# Patient Record
Sex: Female | Born: 1940 | Race: White | Hispanic: No | Marital: Married | Smoking: Former smoker
Health system: Southern US, Community
[De-identification: ages and names within clinical notes are randomized; demographics above are authoritative.]

## PROBLEM LIST (undated history)

## (undated) DIAGNOSIS — R51 Headache: Secondary | ICD-10-CM

## (undated) DIAGNOSIS — E039 Hypothyroidism, unspecified: Secondary | ICD-10-CM

## (undated) DIAGNOSIS — R519 Headache, unspecified: Secondary | ICD-10-CM

## (undated) DIAGNOSIS — F32A Depression, unspecified: Secondary | ICD-10-CM

## (undated) DIAGNOSIS — E785 Hyperlipidemia, unspecified: Secondary | ICD-10-CM

## (undated) DIAGNOSIS — F329 Major depressive disorder, single episode, unspecified: Secondary | ICD-10-CM

## (undated) DIAGNOSIS — N6459 Other signs and symptoms in breast: Secondary | ICD-10-CM

## (undated) DIAGNOSIS — I10 Essential (primary) hypertension: Secondary | ICD-10-CM

## (undated) DIAGNOSIS — H18519 Endothelial corneal dystrophy, unspecified eye: Secondary | ICD-10-CM

## (undated) DIAGNOSIS — T4145XA Adverse effect of unspecified anesthetic, initial encounter: Secondary | ICD-10-CM

## (undated) DIAGNOSIS — M1990 Unspecified osteoarthritis, unspecified site: Secondary | ICD-10-CM

## (undated) DIAGNOSIS — H04129 Dry eye syndrome of unspecified lacrimal gland: Secondary | ICD-10-CM

## (undated) DIAGNOSIS — T8859XA Other complications of anesthesia, initial encounter: Secondary | ICD-10-CM

## (undated) DIAGNOSIS — D649 Anemia, unspecified: Secondary | ICD-10-CM

## (undated) DIAGNOSIS — F419 Anxiety disorder, unspecified: Secondary | ICD-10-CM

## (undated) DIAGNOSIS — Z8739 Personal history of other diseases of the musculoskeletal system and connective tissue: Secondary | ICD-10-CM

## (undated) DIAGNOSIS — B070 Plantar wart: Secondary | ICD-10-CM

## (undated) DIAGNOSIS — E7800 Pure hypercholesterolemia, unspecified: Secondary | ICD-10-CM

## (undated) DIAGNOSIS — Z8669 Personal history of other diseases of the nervous system and sense organs: Secondary | ICD-10-CM

## (undated) DIAGNOSIS — K259 Gastric ulcer, unspecified as acute or chronic, without hemorrhage or perforation: Secondary | ICD-10-CM

## (undated) DIAGNOSIS — T7840XA Allergy, unspecified, initial encounter: Secondary | ICD-10-CM

## (undated) DIAGNOSIS — K219 Gastro-esophageal reflux disease without esophagitis: Secondary | ICD-10-CM

## (undated) HISTORY — PX: DILATION AND CURETTAGE OF UTERUS: SHX78

## (undated) HISTORY — DX: Plantar wart: B07.0

## (undated) HISTORY — PX: UMBILICAL HERNIA REPAIR: SHX196

## (undated) HISTORY — DX: Unspecified osteoarthritis, unspecified site: M19.90

## (undated) HISTORY — PX: CORNEAL TRANSPLANT: SHX108

## (undated) HISTORY — DX: Hypothyroidism, unspecified: E03.9

## (undated) HISTORY — DX: Essential (primary) hypertension: I10

## (undated) HISTORY — DX: Major depressive disorder, single episode, unspecified: F32.9

## (undated) HISTORY — DX: Gastro-esophageal reflux disease without esophagitis: K21.9

## (undated) HISTORY — DX: Personal history of other diseases of the musculoskeletal system and connective tissue: Z87.39

## (undated) HISTORY — PX: OTHER SURGICAL HISTORY: SHX169

## (undated) HISTORY — DX: Personal history of other diseases of the nervous system and sense organs: Z86.69

## (undated) HISTORY — DX: Anxiety disorder, unspecified: F41.9

## (undated) HISTORY — PX: THYROIDECTOMY, PARTIAL: SHX18

## (undated) HISTORY — DX: Allergy, unspecified, initial encounter: T78.40XA

## (undated) HISTORY — PX: EYE SURGERY: SHX253

## (undated) HISTORY — DX: Hyperlipidemia, unspecified: E78.5

## (undated) HISTORY — DX: Pure hypercholesterolemia, unspecified: E78.00

## (undated) HISTORY — DX: Depression, unspecified: F32.A

---

## 1998-01-03 ENCOUNTER — Ambulatory Visit (HOSPITAL_COMMUNITY): Admission: RE | Admit: 1998-01-03 | Discharge: 1998-01-03 | Payer: Self-pay | Admitting: *Deleted

## 1998-01-25 ENCOUNTER — Other Ambulatory Visit: Admission: RE | Admit: 1998-01-25 | Discharge: 1998-01-25 | Payer: Self-pay | Admitting: Obstetrics and Gynecology

## 1998-05-03 ENCOUNTER — Other Ambulatory Visit: Admission: RE | Admit: 1998-05-03 | Discharge: 1998-05-03 | Payer: Self-pay | Admitting: Obstetrics and Gynecology

## 1998-07-28 ENCOUNTER — Other Ambulatory Visit: Admission: RE | Admit: 1998-07-28 | Discharge: 1998-07-28 | Payer: Self-pay | Admitting: Obstetrics and Gynecology

## 1999-02-01 ENCOUNTER — Other Ambulatory Visit: Admission: RE | Admit: 1999-02-01 | Discharge: 1999-02-01 | Payer: Self-pay | Admitting: Obstetrics and Gynecology

## 1999-04-21 ENCOUNTER — Encounter: Payer: Self-pay | Admitting: Obstetrics and Gynecology

## 1999-04-21 ENCOUNTER — Encounter: Admission: RE | Admit: 1999-04-21 | Discharge: 1999-04-21 | Payer: Self-pay | Admitting: Obstetrics and Gynecology

## 1999-10-02 ENCOUNTER — Other Ambulatory Visit: Admission: RE | Admit: 1999-10-02 | Discharge: 1999-10-02 | Payer: Self-pay | Admitting: Obstetrics and Gynecology

## 2000-02-01 ENCOUNTER — Other Ambulatory Visit: Admission: RE | Admit: 2000-02-01 | Discharge: 2000-02-01 | Payer: Self-pay | Admitting: Obstetrics and Gynecology

## 2000-03-05 ENCOUNTER — Ambulatory Visit (HOSPITAL_COMMUNITY): Admission: RE | Admit: 2000-03-05 | Discharge: 2000-03-05 | Payer: Self-pay | Admitting: *Deleted

## 2000-03-05 ENCOUNTER — Encounter: Payer: Self-pay | Admitting: *Deleted

## 2000-03-12 ENCOUNTER — Ambulatory Visit (HOSPITAL_COMMUNITY): Admission: RE | Admit: 2000-03-12 | Discharge: 2000-03-14 | Payer: Self-pay | Admitting: Specialist

## 2000-04-10 ENCOUNTER — Ambulatory Visit (HOSPITAL_COMMUNITY): Admission: RE | Admit: 2000-04-10 | Discharge: 2000-04-10 | Payer: Self-pay | Admitting: *Deleted

## 2000-04-10 ENCOUNTER — Encounter (INDEPENDENT_AMBULATORY_CARE_PROVIDER_SITE_OTHER): Payer: Self-pay | Admitting: Specialist

## 2000-04-23 ENCOUNTER — Encounter: Admission: RE | Admit: 2000-04-23 | Discharge: 2000-04-23 | Payer: Self-pay | Admitting: Obstetrics and Gynecology

## 2000-04-23 ENCOUNTER — Encounter: Payer: Self-pay | Admitting: Obstetrics and Gynecology

## 2000-06-26 ENCOUNTER — Encounter (INDEPENDENT_AMBULATORY_CARE_PROVIDER_SITE_OTHER): Payer: Self-pay

## 2000-06-26 ENCOUNTER — Ambulatory Visit (HOSPITAL_COMMUNITY): Admission: RE | Admit: 2000-06-26 | Discharge: 2000-06-26 | Payer: Self-pay | Admitting: Obstetrics and Gynecology

## 2001-05-20 ENCOUNTER — Other Ambulatory Visit: Admission: RE | Admit: 2001-05-20 | Discharge: 2001-05-20 | Payer: Self-pay | Admitting: Obstetrics and Gynecology

## 2001-05-27 ENCOUNTER — Encounter: Payer: Self-pay | Admitting: Obstetrics and Gynecology

## 2001-05-27 ENCOUNTER — Encounter: Admission: RE | Admit: 2001-05-27 | Discharge: 2001-05-27 | Payer: Self-pay | Admitting: Obstetrics and Gynecology

## 2001-08-20 ENCOUNTER — Ambulatory Visit (HOSPITAL_COMMUNITY): Admission: RE | Admit: 2001-08-20 | Discharge: 2001-08-20 | Payer: Self-pay | Admitting: Specialist

## 2002-05-20 ENCOUNTER — Other Ambulatory Visit: Admission: RE | Admit: 2002-05-20 | Discharge: 2002-05-20 | Payer: Self-pay | Admitting: Obstetrics and Gynecology

## 2002-06-01 ENCOUNTER — Encounter: Payer: Self-pay | Admitting: Obstetrics and Gynecology

## 2002-06-01 ENCOUNTER — Encounter: Admission: RE | Admit: 2002-06-01 | Discharge: 2002-06-01 | Payer: Self-pay | Admitting: Obstetrics and Gynecology

## 2002-09-03 ENCOUNTER — Ambulatory Visit (HOSPITAL_COMMUNITY): Admission: RE | Admit: 2002-09-03 | Discharge: 2002-09-03 | Payer: Self-pay | Admitting: *Deleted

## 2003-06-07 ENCOUNTER — Encounter: Admission: RE | Admit: 2003-06-07 | Discharge: 2003-06-07 | Payer: Self-pay | Admitting: Obstetrics and Gynecology

## 2004-07-05 ENCOUNTER — Other Ambulatory Visit: Admission: RE | Admit: 2004-07-05 | Discharge: 2004-07-05 | Payer: Self-pay | Admitting: Obstetrics and Gynecology

## 2004-08-02 ENCOUNTER — Encounter: Admission: RE | Admit: 2004-08-02 | Discharge: 2004-08-02 | Payer: Self-pay | Admitting: Obstetrics and Gynecology

## 2005-07-02 ENCOUNTER — Other Ambulatory Visit: Admission: RE | Admit: 2005-07-02 | Discharge: 2005-07-02 | Payer: Self-pay | Admitting: Obstetrics and Gynecology

## 2005-08-22 ENCOUNTER — Encounter: Admission: RE | Admit: 2005-08-22 | Discharge: 2005-08-22 | Payer: Self-pay | Admitting: Obstetrics and Gynecology

## 2005-12-31 ENCOUNTER — Encounter: Admission: RE | Admit: 2005-12-31 | Discharge: 2005-12-31 | Payer: Self-pay | Admitting: Internal Medicine

## 2006-08-27 ENCOUNTER — Encounter: Admission: RE | Admit: 2006-08-27 | Discharge: 2006-08-27 | Payer: Self-pay | Admitting: Obstetrics and Gynecology

## 2007-09-09 ENCOUNTER — Encounter: Admission: RE | Admit: 2007-09-09 | Discharge: 2007-09-09 | Payer: Self-pay | Admitting: Obstetrics and Gynecology

## 2008-06-24 ENCOUNTER — Encounter: Admission: RE | Admit: 2008-06-24 | Discharge: 2008-06-24 | Payer: Self-pay | Admitting: Orthopedic Surgery

## 2008-09-28 ENCOUNTER — Encounter: Admission: RE | Admit: 2008-09-28 | Discharge: 2008-09-28 | Payer: Self-pay | Admitting: Obstetrics and Gynecology

## 2009-08-30 DIAGNOSIS — N63 Unspecified lump in unspecified breast: Secondary | ICD-10-CM | POA: Insufficient documentation

## 2009-09-02 ENCOUNTER — Encounter: Admission: RE | Admit: 2009-09-02 | Discharge: 2009-09-02 | Payer: Self-pay | Admitting: Obstetrics and Gynecology

## 2009-10-10 ENCOUNTER — Encounter
Admission: RE | Admit: 2009-10-10 | Discharge: 2009-10-10 | Payer: Self-pay | Source: Home / Self Care | Admitting: Obstetrics and Gynecology

## 2010-05-12 ENCOUNTER — Emergency Department (HOSPITAL_COMMUNITY)
Admission: EM | Admit: 2010-05-12 | Discharge: 2010-05-12 | Payer: Self-pay | Source: Home / Self Care | Admitting: Emergency Medicine

## 2010-06-11 ENCOUNTER — Encounter: Payer: Self-pay | Admitting: Obstetrics and Gynecology

## 2010-09-12 ENCOUNTER — Other Ambulatory Visit: Payer: Self-pay | Admitting: Internal Medicine

## 2010-09-12 ENCOUNTER — Ambulatory Visit (HOSPITAL_COMMUNITY)
Admission: RE | Admit: 2010-09-12 | Discharge: 2010-09-12 | Disposition: A | Discharge status: Home / Self Care | Payer: MEDICARE | Source: Ambulatory Visit | Attending: Internal Medicine | Admitting: Internal Medicine

## 2010-09-12 DIAGNOSIS — M7989 Other specified soft tissue disorders: Secondary | ICD-10-CM

## 2010-09-12 DIAGNOSIS — R52 Pain, unspecified: Secondary | ICD-10-CM

## 2010-09-12 DIAGNOSIS — M79609 Pain in unspecified limb: Secondary | ICD-10-CM | POA: Insufficient documentation

## 2010-10-06 NOTE — Procedures (Signed)
[facility]  Patient:    CARTIER, MAPEL                       MRN: [id number] Proc. Date: 04/10/00 Adm. Date:  [id number] Attending:  Sabino Gasser                           Procedure Report  PROCEDURE:  Upper endoscopy.  ENDOSCOPIST:  Sabino Gasser, M.D.  ANESTHESIA:  Demerol 60 mg, Versed 6 mg.  DESCRIPTION OF PROCEDURE:  With the patient mildly sedated in the left lateral decubitus position, the Olympus videoscopic endoscope was inserted in the mouth and passed under direct vision through the esophagus which appeared grossly normal.  No evidence of Barretts.  We entered into the stomach. Fundus, body and antrum were visualized.  The antrum showed some erythema which we biopsied.  We entered into the duodenal bulb and second portion of the duodenum, both of which appeared normal.  From this point, the endoscope was then slowly withdrawn taking circumferential views of the entire duodenal mucosa and the endoscope then pulled back into the stomach and placed in retroflexion to view the stomach from below.  The endoscope was then straightened, and a guidewire was passed.  Subsequently, Savary dilators 15 and 17 were passed with minimal resistance.  The guidewire was removed with the last one.  The endoscope was reinserted and no bleeding was noted.  The patients vital signs and pulse oximeter remained stable.  The patient tolerated the procedure well without apparent complications.  FINDINGS:  Mild erythema of the distal stomach; await biopsy report.  The patient will call for the results.  We will await clinical response and have patient follow up with me as an outpatient and will order an esophageal manometry study to see if this has any contribution to the patients dysphagia if she does not tolerate dilation alone. DD:  04/10/00 TD:  04/11/00 Job: [id number]

## 2010-10-06 NOTE — Op Note (Signed)
[facility]  Patient:    Sandra Harvey, Sandra Harvey                       MRN: [id number] Proc. Date: 06/26/00 Adm. Date:  [id number] Attending:  Dierdre Forth Pearline                           Operative Report  PREOPERATIVE DIAGNOSIS:       Postmenopausal bleeding.  POSTOPERATIVE DIAGNOSES:      1. Postmenopausal bleeding.                               2. Endometrial polyp.  OPERATIONS:                   1. Diagnostic hysteroscopy.                               2. Diagnostic dilation and curettage.                               3. Removal of endometrial polyp.  SURGEON:                      Vanessa P. Pennie Rushing, M.D.  ANESTHESIA:                   General LMA.  ESTIMATED BLOOD LOSS:         Less than 25 cc.  COMPLICATIONS:                None.  FLUID DEFICIT FROM HYSTEROSCOPY:            40 cc.  FINDINGS:                     The uterus sounded to 7 cm.  There was a moderate amount of endocervical curettings.  The endometrium showed atrophic endometrium except for one polypoid lesion measuring approximately 1 cm.  DESCRIPTION OF PROCEDURE:     The patient was taken to the operating room after appropriate identification and placed on the operating table.  After the attainment of adequate general anesthesia, she was placed in the modified lithotomy position.  The perineum and vagina were prepped with multiple layers of Betadine and draped as a sterile field.  A weighted speculum was placed in the posterior vagina and a single tooth tenaculum was placed on the anterior cervix.  The uterus was sounded to 7 cm and the endocervical canal curetted. The hysteroscope was then used to visualize the endocervix and endometrial cavity.  The above noted polyp was visualized and documented.  Randall stone forceps were then used to remove that polypoid lesion and curettage of the remainder of the uterine cavity undertaken.  Hysteroscopic examination subsequent to  that revealed that the polyp had, indeed, been removed.  All instruments were then removed from the uterus and vagina and the patient awakened from general anesthesia then taken to the recovery room in satisfactory condition, having tolerated the procedure well with sponge and instrument counts correct.  SPECIMENS TO PATHOLOGY:       1. Endocervical curettings.  2. Endometrial polyp.                               3. Endometrial curettings. DD:  06/26/00 TD:  06/27/00 Job: [id number]

## 2010-10-06 NOTE — Op Note (Signed)
   NAME:  Sandra Harvey, Sandra Harvey                          ACCOUNT NO.:  [id number]   MEDICAL RECORD NO.:  [id number]                   PATIENT TYPE:  AMB   LOCATION:  ENDO                                 FACILITY:  [facility]   PHYSICIAN:  Georgiana Spinner, M.D.                 DATE OF BIRTH:  1940-08-28   DATE OF PROCEDURE:  09/03/2002  DATE OF DISCHARGE:                                 OPERATIVE REPORT   PROCEDURE:  Upper endoscopy with dilation.   INDICATIONS:  Dysphagia.   ANESTHESIA:  Demerol 50 mg, Versed 5 mg.   DESCRIPTION OF PROCEDURE:  With the patient mildly sedated in the left  lateral decubitus position, the Olympus videoscopic endoscope was inserted  in the mouth, passed under direct vision through the esophagus into the  stomach, through the pylorus, into the duodenal bulb, second portion of the  duodenum, all of which appeared grossly normal.  From this point the  endoscope was slowly withdrawn, taking circumferential views of the duodenal  mucosa until the endoscope pulled back into the stomach, placed in  retroflexion to view the stomach from below.  The endoscope was then  straightened and withdrawn, taking circumferential views of the gastric and  esophageal mucosa until the endoscope was pulled proximally, and then we  advanced the endoscope once again distally into the stomach, where a  guidewire was passed, the endoscope withdrawn.  Subsequently the Savary 17  mm dilator was passed easily over the guidewire.  With the dilator, the  guidewire was removed.  The patient's vital signs and pulse oximetry  remained stable.  The patient tolerated the procedure well without apparent  complications.   FINDINGS:  Unremarkable examination with dilation of esophagus to 17 Savary.  There was no heme on the dilator.   PLAN:  Await clinical response.  Proceed to colonoscopy.                                               Georgiana Spinner, M.D.    GMO/MEDQ  D:  09/03/2002  T:   09/03/2002  Job:  [id number]

## 2010-10-06 NOTE — Op Note (Signed)
   NAME:  Sandra Harvey, Sandra Harvey                          ACCOUNT NO.:  [id number]   MEDICAL RECORD NO.:  [id number]                   PATIENT TYPE:  AMB   LOCATION:  ENDO                                 FACILITY:  [facility]   PHYSICIAN:  Georgiana Spinner, M.D.                 DATE OF BIRTH:  [birth year]   DATE OF PROCEDURE:  09/03/2002  DATE OF DISCHARGE:                                 OPERATIVE REPORT   PROCEDURE PERFORMED:  Colonoscopy.   INDICATIONS FOR PROCEDURE:  Cancer screening.   ANESTHESIA:  None further given.   DESCRIPTION OF PROCEDURE:  With the patient mildly sedated in the left  lateral decubitus position, the Olympus videoscopic colonoscope was inserted  in the rectum and passed under direct vision to the cecum, identified by the  ileocecal valve and appendiceal orifice, both of which were photographed.  From this point, the colonoscope was slowly withdrawn, taking  circumferential views of the entire colonic mucosa stopping only in the  rectum, which appeared normal on direct and showed hemorrhoids on  retroflexed view.  The endoscope was straightened and withdrawn.  The  patient's vital signs and pulse oximetry remained stable.  The patient  tolerated the procedure well without apparent complications.   FINDINGS:  Internal hemorrhoids, otherwise unremarkable examination.   PLAN:  Will have patient follow up with me to assess clinical response of  esophageal dilation.                                               Georgiana Spinner, M.D.    GMO/MEDQ  D:  09/03/2002  T:  09/03/2002  Job:  [id number]

## 2010-11-27 ENCOUNTER — Other Ambulatory Visit: Payer: Self-pay | Admitting: Obstetrics and Gynecology

## 2010-11-27 DIAGNOSIS — Z1231 Encounter for screening mammogram for malignant neoplasm of breast: Secondary | ICD-10-CM

## 2010-12-04 ENCOUNTER — Ambulatory Visit
Admission: RE | Admit: 2010-12-04 | Discharge: 2010-12-04 | Disposition: A | Discharge status: Home / Self Care | Payer: Medicare (Managed Care) | Source: Ambulatory Visit | Attending: Obstetrics and Gynecology | Admitting: Obstetrics and Gynecology

## 2010-12-04 DIAGNOSIS — Z1231 Encounter for screening mammogram for malignant neoplasm of breast: Secondary | ICD-10-CM

## 2011-02-19 HISTORY — PX: HAND SURGERY: SHX662

## 2011-07-11 DIAGNOSIS — H2640 Unspecified secondary cataract: Secondary | ICD-10-CM | POA: Insufficient documentation

## 2011-10-08 ENCOUNTER — Ambulatory Visit (INDEPENDENT_AMBULATORY_CARE_PROVIDER_SITE_OTHER): Payer: 59 | Admitting: Obstetrics and Gynecology

## 2011-10-08 ENCOUNTER — Other Ambulatory Visit: Payer: Self-pay | Admitting: Obstetrics and Gynecology

## 2011-10-08 ENCOUNTER — Encounter: Payer: Self-pay | Admitting: Obstetrics and Gynecology

## 2011-10-08 VITALS — BP 118/60 | Resp 16 | Ht 61.5 in | Wt 172.0 lb

## 2011-10-08 DIAGNOSIS — K219 Gastro-esophageal reflux disease without esophagitis: Secondary | ICD-10-CM | POA: Insufficient documentation

## 2011-10-08 DIAGNOSIS — E039 Hypothyroidism, unspecified: Secondary | ICD-10-CM

## 2011-10-08 DIAGNOSIS — Z124 Encounter for screening for malignant neoplasm of cervix: Secondary | ICD-10-CM

## 2011-10-08 DIAGNOSIS — I2510 Atherosclerotic heart disease of native coronary artery without angina pectoris: Secondary | ICD-10-CM
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
## 2011-10-10 LAB — PAP IG W/ RFLX HPV ASCU

## 2011-11-13 ENCOUNTER — Other Ambulatory Visit: Payer: Self-pay | Admitting: Obstetrics and Gynecology

## 2011-11-13 DIAGNOSIS — Z1231 Encounter for screening mammogram for malignant neoplasm of breast: Secondary | ICD-10-CM

## 2011-12-13 ENCOUNTER — Ambulatory Visit
Admission: RE | Admit: 2011-12-13 | Discharge: 2011-12-13 | Disposition: A | Discharge status: Home / Self Care | Payer: Medicare Other | Source: Ambulatory Visit | Attending: Obstetrics and Gynecology | Admitting: Obstetrics and Gynecology

## 2011-12-13 DIAGNOSIS — Z1231 Encounter for screening mammogram for malignant neoplasm of breast: Secondary | ICD-10-CM

## 2011-12-17 ENCOUNTER — Encounter: Payer: Self-pay | Admitting: Obstetrics and Gynecology

## 2011-12-21 ENCOUNTER — Telehealth: Payer: Self-pay | Admitting: Obstetrics and Gynecology

## 2011-12-21 NOTE — Telephone Encounter (Signed)
Tc to pt per telephone call. Dense breast letter explained to pt. Pt voices understanding.  

## 2011-12-21 NOTE — Telephone Encounter (Signed)
Chandra/[facility]. [facility].

## 2011-12-21 NOTE — Telephone Encounter (Signed)
Pc to pt per telephone call. Number busy

## 2012-01-23 DIAGNOSIS — E119 Type 2 diabetes mellitus without complications: Secondary | ICD-10-CM | POA: Insufficient documentation

## 2012-07-23 DIAGNOSIS — Z961 Presence of intraocular lens: Secondary | ICD-10-CM | POA: Insufficient documentation

## 2012-11-14 ENCOUNTER — Other Ambulatory Visit: Payer: Self-pay

## 2012-11-14 DIAGNOSIS — Z1231 Encounter for screening mammogram for malignant neoplasm of breast: Secondary | ICD-10-CM

## 2012-12-16 ENCOUNTER — Ambulatory Visit: Payer: Medicare Other

## 2012-12-17 ENCOUNTER — Ambulatory Visit
Admission: RE | Admit: 2012-12-17 | Discharge: 2012-12-17 | Disposition: A | Discharge status: Home / Self Care | Payer: Medicare Other | Source: Ambulatory Visit

## 2012-12-17 DIAGNOSIS — Z1231 Encounter for screening mammogram for malignant neoplasm of breast: Secondary | ICD-10-CM

## 2013-01-30 DIAGNOSIS — Z947 Corneal transplant status: Secondary | ICD-10-CM | POA: Insufficient documentation

## 2013-12-30 ENCOUNTER — Other Ambulatory Visit: Payer: Self-pay

## 2013-12-30 DIAGNOSIS — Z1231 Encounter for screening mammogram for malignant neoplasm of breast: Secondary | ICD-10-CM

## 2014-01-08 ENCOUNTER — Ambulatory Visit
Admission: RE | Admit: 2014-01-08 | Discharge: 2014-01-08 | Disposition: A | Discharge status: Home / Self Care | Payer: 59 | Source: Ambulatory Visit

## 2014-01-08 DIAGNOSIS — Z1231 Encounter for screening mammogram for malignant neoplasm of breast: Secondary | ICD-10-CM

## 2014-06-03 ENCOUNTER — Ambulatory Visit (INDEPENDENT_AMBULATORY_CARE_PROVIDER_SITE_OTHER): Payer: Medicare Other

## 2014-06-03 VITALS — BP 144/72 | HR 76 | Resp 12

## 2014-06-03 DIAGNOSIS — M79676 Pain in unspecified toe(s): Secondary | ICD-10-CM

## 2014-06-03 DIAGNOSIS — M2041 Other hammer toe(s) (acquired), right foot: Secondary | ICD-10-CM

## 2014-06-03 DIAGNOSIS — B351 Tinea unguium: Secondary | ICD-10-CM

## 2014-06-03 NOTE — Patient Instructions (Signed)

## 2014-06-03 NOTE — Progress Notes (Signed)
   Subjective:    Patient ID: Sandra Harvey, female    DOB: August 27, 1940, 74 y.o.   MRN: [id number]  HPI PT STATED RT 2ND TOENAIL AND LT FOOT 1ST TOENAIL ARE SORE AND THICK FOR LONG TERM. THE TOENAILS ARE GETTING WORSE AND GET AGGRAVATED BY PRESSURE. TRIED NO TREATMENT.   Review of Systems  Skin: Positive for color change.  Neurological: Positive for headaches.  All other systems reviewed and are negative.      Objective:   Physical Exam 74 year old white female well-developed well-nourished oriented 3 presents at this time with a long-standing history of painful toes on the left hallux nail bed and the second toe right and thickening discoloration proptosis incurvation yellowing consistent with onychomycosis have had previous partial nail excision of nail excisions both hallux however the left hallux has regrown right hallux shows a eschar nailbed. 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 Appears to be contusion of the distal tuft of the second toe as well as painful onychomycosis of nails first left second right. Remaining nails in the right side also shows some thickening dystrophy and discoloration although not painful or symptomatic. Under the exam unremarkable rectus foot type pedal pulses palpable DP and PT +2 over 4 Refill time 3 seconds epicritic and proprioceptive sensations intact and symmetric there is normal plantar response DTRs not listed.       Assessment & Plan:  Assessment onychomycosis painful mycotic nails first left second right with friability dystrophy and discoloration mycotic nails debrided at this time may be candidate for future care recommend topical antifungal up on an as-needed basis for treatment if continues be an  issue nail excision could be considered. At this time also recommend appropriate accommodative shoes some tube foam padding dispensed for the second toe right foot help keep pressure off the distal clavus and keratosis of the second digit due to mallet toe type contracture. Follow-up in the future as needed  Alvan Dameichard Amany Rando DPM

## 2015-01-12 ENCOUNTER — Other Ambulatory Visit: Payer: Self-pay

## 2015-01-12 DIAGNOSIS — Z1231 Encounter for screening mammogram for malignant neoplasm of breast: Secondary | ICD-10-CM

## 2015-01-31 ENCOUNTER — Ambulatory Visit
Admission: RE | Admit: 2015-01-31 | Discharge: 2015-01-31 | Disposition: A | Discharge status: Home / Self Care | Payer: Medicare Other | Source: Ambulatory Visit

## 2015-01-31 DIAGNOSIS — Z1231 Encounter for screening mammogram for malignant neoplasm of breast: Secondary | ICD-10-CM

## 2016-02-02 ENCOUNTER — Other Ambulatory Visit: Payer: Self-pay | Admitting: Obstetrics and Gynecology

## 2016-02-02 DIAGNOSIS — Z1231 Encounter for screening mammogram for malignant neoplasm of breast: Secondary | ICD-10-CM

## 2016-02-08 ENCOUNTER — Ambulatory Visit
Admission: RE | Admit: 2016-02-08 | Discharge: 2016-02-08 | Disposition: A | Discharge status: Home / Self Care | Payer: Medicare Other | Source: Ambulatory Visit | Attending: Obstetrics and Gynecology | Admitting: Obstetrics and Gynecology

## 2016-02-08 DIAGNOSIS — Z1231 Encounter for screening mammogram for malignant neoplasm of breast: Secondary | ICD-10-CM

## 2016-12-28 ENCOUNTER — Other Ambulatory Visit: Payer: Self-pay | Admitting: Obstetrics and Gynecology

## 2016-12-28 DIAGNOSIS — Z1231 Encounter for screening mammogram for malignant neoplasm of breast: Secondary | ICD-10-CM

## 2017-02-25 ENCOUNTER — Encounter: Payer: Self-pay | Admitting: Podiatry

## 2017-02-25 ENCOUNTER — Ambulatory Visit (INDEPENDENT_AMBULATORY_CARE_PROVIDER_SITE_OTHER): Payer: Medicare Other | Admitting: Podiatry

## 2017-02-25 DIAGNOSIS — B351 Tinea unguium: Secondary | ICD-10-CM

## 2017-02-25 DIAGNOSIS — E1151 Type 2 diabetes mellitus with diabetic peripheral angiopathy without gangrene: Secondary | ICD-10-CM

## 2017-02-25 NOTE — Progress Notes (Signed)
Subjective:  Patient ID: Sandra Harvey, female    DOB: [birth year],  MRN: [id number]  Chief Complaint  Patient presents with  . Nail Problem    left great toe nail is tender  nail care     76 y.o. female presents with the above complaint. Endorses DM with good control. Reports history of attempted left great toenail removal with recurrence. Reports painful left great toenail. Reports other nails are thickened. States that she is unable to care for them herself  Past Medical History:  Diagnosis Date  . Allergy   . Anxiety   . Arthritis   . Cataract   . Depression   . Diabetes mellitus   . GERD (gastroesophageal reflux disease)   . History of migraine headaches   . History of osteopenia   . Hypercholesterolemia   . Hyperlipidemia   . Hypertension   . Hypothyroidism   . Plantar warts    Past Surgical History:  Procedure Laterality Date  . CORNEAL TRANSPLANT  2007, 2010  . DILATION AND CURETTAGE OF UTERUS    . EYE SURGERY    . HAND SURGERY  02/2011    Current Outpatient Prescriptions:  .  acetaminophen (TYLENOL) 325 MG tablet, Take 650 mg by mouth every 6 (six) hours as needed., Disp: , Rfl:  .  aspirin EC 81 MG tablet, Take 81 mg by mouth daily., Disp: , Rfl:  .  benzonatate (TESSALON) 100 MG capsule, Take 100 mg by mouth 3 (three) times daily as needed., Disp: , Rfl:  .  Chlorphen-Phenyleph-ASA (ALKA-SELTZER PLUS COLD PO), Take by mouth., Disp: , Rfl:  .  cholecalciferol (VITAMIN D) 1000 UNITS tablet, Take 1,000 Units by mouth 2 (two) times daily., Disp: , Rfl:  .  diphenhydrAMINE (BENADRYL) 25 mg capsule, Take 25 mg by mouth every 6 (six) hours as needed., Disp: , Rfl:  .  furosemide (LASIX) 40 MG tablet, Take 40 mg by mouth 2 (two) times daily., Disp: , Rfl:  .  imipramine (TOFRANIL) 50 MG tablet, Take 50 mg by mouth at bedtime., Disp: , Rfl:  .  levothyroxine (SYNTHROID, LEVOTHROID) 100 MCG tablet, Take 100 mcg by mouth daily., Disp: , Rfl:  .  levothyroxine  (SYNTHROID, LEVOTHROID) 112 MCG tablet, Take 112 mcg by mouth daily., Disp: , Rfl:  .  pantoprazole (PROTONIX) 40 MG tablet, Take 40 mg by mouth 2 (two) times daily., Disp: , Rfl:  .  polyvinyl alcohol-povidone (HYPOTEARS) 1.4-0.6 % ophthalmic solution, 1-2 drops as needed., Disp: , Rfl:  .  rosuvastatin (CRESTOR) 10 MG tablet, Take 10 mg by mouth daily., Disp: , Rfl:  .  rosuvastatin (CRESTOR) 5 MG tablet, Take 5 mg by mouth daily., Disp: , Rfl:  .  sertraline (ZOLOFT) 50 MG tablet, Take 50 mg by mouth daily., Disp: , Rfl:  .  telmisartan (MICARDIS) 40 MG tablet, Take 40 mg by mouth daily., Disp: , Rfl:   Allergies  Allergen Reactions  . Ibuprofen Nausea Only  . Nsaids Nausea And Vomiting    With prolonged use   Review of Systems Objective:  There were no vitals filed for this visit. General AA&O x3. Normal mood and affect.  Vascular Dorsalis pedis and posterior tibial pulses  present 1+ and absent bilaterally  Capillary refill normal to all digits. Pedal hair growth absent.  Neurologic Epicritic sensation present bilaterally.  Dermatologic No open lesions. Interspaces clear of maceration.  Normal skin temperature and turgor. Hyperkeratotic lesions: None bilaterally  Nails: brittle, discoloration Yellow,  onychomycosis, thickening  Orthopedic: No history of amputation. MMT 5/5 in dorsiflexion, plantarflexion, inversion, and eversion. Normal lower extremity joint ROM without pain or crepitus.   Assessment & Plan:  Patient was evaluated and treated and all questions answered.  Diabetes with PAD, Onychomycosis -Educated on diabetic footcare. Diabetic risk level 1 -Nails x10 debrided sharply and manually with large nail nipper and rotary burr.  Procedure: Nail Debridement Rationale: Patient meets criteria for routine foot care due to class B findings Type of Debridement: manual, sharp debridement. Instrumentation: Nail nipper, rotary burr. Number of Nails: 9  Return in about 3  months (around 05/28/2017).

## 2017-03-06 ENCOUNTER — Ambulatory Visit
Admission: RE | Admit: 2017-03-06 | Discharge: 2017-03-06 | Disposition: A | Discharge status: Home / Self Care | Payer: Medicare Other | Source: Ambulatory Visit | Attending: Obstetrics and Gynecology | Admitting: Obstetrics and Gynecology

## 2017-03-06 DIAGNOSIS — Z1231 Encounter for screening mammogram for malignant neoplasm of breast: Secondary | ICD-10-CM

## 2017-05-21 HISTORY — PX: UMBILICAL HERNIA REPAIR: SHX196

## 2017-05-21 HISTORY — PX: OTHER SURGICAL HISTORY: SHX169

## 2017-05-27 ENCOUNTER — Ambulatory Visit (INDEPENDENT_AMBULATORY_CARE_PROVIDER_SITE_OTHER): Payer: Medicare Other | Admitting: Podiatry

## 2017-05-27 DIAGNOSIS — B351 Tinea unguium: Secondary | ICD-10-CM

## 2017-05-27 DIAGNOSIS — E1151 Type 2 diabetes mellitus with diabetic peripheral angiopathy without gangrene: Secondary | ICD-10-CM

## 2017-05-31 DIAGNOSIS — G5600 Carpal tunnel syndrome, unspecified upper limb: Secondary | ICD-10-CM | POA: Insufficient documentation

## 2017-06-12 DIAGNOSIS — M25532 Pain in left wrist: Secondary | ICD-10-CM | POA: Insufficient documentation

## 2017-06-20 NOTE — Progress Notes (Signed)
  Subjective:  Patient ID: Sandra Harvey, female    DOB: 12-04-1940,  MRN: [id number]  Chief Complaint  Patient presents with  . Diabetes  . Nail Problem   77 y.o. female returns for diabetic foot care.  Did not check sugars this a.m.  Last saw her PCP Dr. Yancey FlemingsMulder far in November.  Objective:   General AA&O x3. Normal mood and affect.  Vascular Dorsalis pedis pulses present 1+ bilaterally  Posterior tibial pulses absent bilaterally  Capillary refill normal to all digits. Pedal hair growth normal.  Neurologic Epicritic sensation present bilaterally. Protective sensation with 5.07 monofilament  present bilaterally. Vibratory sensation present bilaterally.  Dermatologic No open lesions. Interspaces clear of maceration.  Normal skin temperature and turgor. Hyperkeratotic lesions: None bilaterally. Nails: brittle, onychomycosis, thickening, elongation  Orthopedic: No history of amputation. MMT 5/5 in dorsiflexion, plantarflexion, inversion, and eversion. Normal lower extremity joint ROM without pain or crepitus.   Assessment & Plan:  Patient was evaluated and treated and all questions answered.  Diabetes with PAD, Onychomycosis -Educated on diabetic footcare. Diabetic risk level 1 -At risk foot care provided as below.  Procedure: Nail Debridement Rationale: Patient meets criteria for routine foot care due to class B findings Type of Debridement: manual, sharp debridement. Instrumentation: Nail nipper, rotary burr. Number of Nails: 10  Return in about 3 months (around 08/25/2017).

## 2017-08-23 DIAGNOSIS — M169 Osteoarthritis of hip, unspecified: Secondary | ICD-10-CM | POA: Insufficient documentation

## 2017-08-23 NOTE — H&P (Signed)
TOTAL HIP ADMISSION H&P  Patient is admitted for right total hip arthroplasty, anterior approach.  Subjective:  Chief Complaint:     Right hip primary OA / pain  HPI: Sandra Harvey, 77 y.o. female, has a history of pain and functional disability in the right hip(s) due to arthritis and patient has failed non-surgical conservative treatments for greater than 12 weeks to include NSAID's and/or analgesics, corticosteriod injections, use of assistive devices and activity modification.  Onset of symptoms was gradual starting 2+ years ago with gradually worsening course since that time.The patient noted no past surgery on the right hip(s).  Patient currently rates pain in the right hip at 10 out of 10 with activity. Patient has night pain, worsening of pain with activity and weight bearing, trendelenberg gait, pain that interfers with activities of daily living and pain with passive range of motion. Patient has evidence of periarticular osteophytes and joint space narrowing by imaging studies. This condition presents safety issues increasing the risk of falls.  There is no current active infection.  Risks, benefits and expectations were discussed with the patient.  Risks including but not limited to the risk of anesthesia, blood clots, nerve damage, blood vessel damage, failure of the prosthesis, infection and up to and including death.  Patient understand the risks, benefits and expectations and wishes to proceed with surgery.   PCP: Merri Brunette, MD  D/C Plans:       Home   Post-op Meds:       No Rx given   Tranexamic Acid:      To be given - IV   Decadron:      Is to be given  FYI:     ASA  Norco  DME:    Pt already has equipment   PT:    No PT    Patient Active Problem List   Diagnosis Date Noted  . Cardiovascular disease 10/08/2011  . GERD (gastroesophageal reflux disease) 10/08/2011  . Hypothyroidism 10/08/2011   Past Medical History:  Diagnosis Date  . Allergy   . Anxiety   .  Arthritis   . Cataract   . Depression   . Diabetes mellitus   . GERD (gastroesophageal reflux disease)   . History of migraine headaches   . History of osteopenia   . Hypercholesterolemia   . Hyperlipidemia   . Hypertension   . Hypothyroidism   . Plantar warts     Past Surgical History:  Procedure Laterality Date  . CORNEAL TRANSPLANT  2007, 2010  . DILATION AND CURETTAGE OF UTERUS    . EYE SURGERY    . HAND SURGERY  02/2011    No current facility-administered medications for this encounter.    Current Outpatient Medications  Medication Sig Dispense Refill Last Dose  . acetaminophen (TYLENOL) 325 MG tablet Take 650 mg by mouth every 6 (six) hours as needed.   Taking  . aspirin EC 81 MG tablet Take 81 mg by mouth daily.   Taking  . benzonatate (TESSALON) 100 MG capsule Take 100 mg by mouth 3 (three) times daily as needed.   Taking  . Chlorphen-Phenyleph-ASA (ALKA-SELTZER PLUS COLD PO) Take by mouth.   Taking  . cholecalciferol (VITAMIN D) 1000 UNITS tablet Take 1,000 Units by mouth 2 (two) times daily.   Taking  . diphenhydrAMINE (BENADRYL) 25 mg capsule Take 25 mg by mouth every 6 (six) hours as needed.   Taking  . furosemide (LASIX) 40 MG tablet Take 40 mg  by mouth 2 (two) times daily.   Taking  . imipramine (TOFRANIL) 50 MG tablet Take 50 mg by mouth at bedtime.   Taking  . levothyroxine (SYNTHROID, LEVOTHROID) 100 MCG tablet Take 100 mcg by mouth daily.   Taking  . levothyroxine (SYNTHROID, LEVOTHROID) 112 MCG tablet Take 112 mcg by mouth daily.   Taking  . pantoprazole (PROTONIX) 40 MG tablet Take 40 mg by mouth 2 (two) times daily.   Taking  . polyvinyl alcohol-povidone (HYPOTEARS) 1.4-0.6 % ophthalmic solution 1-2 drops as needed.   Taking  . rosuvastatin (CRESTOR) 10 MG tablet Take 10 mg by mouth daily.   Taking  . rosuvastatin (CRESTOR) 5 MG tablet Take 5 mg by mouth daily.   Taking  . sertraline (ZOLOFT) 50 MG tablet Take 50 mg by mouth daily.   Taking  . telmisartan  (MICARDIS) 40 MG tablet Take 40 mg by mouth daily.   Taking   Allergies  Allergen Reactions  . Ibuprofen Nausea Only  . Nsaids Nausea And Vomiting    With prolonged use    Social History   Tobacco Use  . Smoking status: Former Smoker    Packs/day: 1.00    Years: 30.00    Pack years: 30.00    Types: Cigarettes    Last attempt to quit: 11/07/1992    Years since quitting: 24.8  . Smokeless tobacco: Never Used  Substance Use Topics  . Alcohol use: Yes    Alcohol/week: 0.6 oz    Types: 1 Glasses of wine per week    Family History  Problem Relation Age of Onset  . Diabetes Sister   . Hypertension Sister   . Breast cancer Neg Hx      Review of Systems  Constitutional: Negative.   HENT: Negative.   Eyes: Negative.   Respiratory: Negative.   Cardiovascular: Negative.   Gastrointestinal: Positive for heartburn.  Genitourinary: Negative.   Musculoskeletal: Positive for back pain and joint pain.  Skin: Negative.   Neurological: Positive for headaches.  Endo/Heme/Allergies: Positive for environmental allergies.  Psychiatric/Behavioral: Positive for depression. The patient is nervous/anxious.     Objective:  Physical Exam  Constitutional: She is oriented to person, place, and time. She appears well-developed.  HENT:  Head: Normocephalic.  Eyes: Pupils are equal, round, and reactive to light.  Neck: Neck supple. No JVD present. No tracheal deviation present. No thyromegaly present.  Cardiovascular: Normal rate, regular rhythm and intact distal pulses.  Respiratory: Effort normal and breath sounds normal. No respiratory distress. She has no wheezes.  GI: Soft. There is no tenderness. There is no guarding.  Musculoskeletal:       Right hip: She exhibits decreased range of motion, decreased strength, tenderness and bony tenderness. She exhibits no swelling, no deformity and no laceration.  Lymphadenopathy:    She has no cervical adenopathy.  Neurological: She is alert and  oriented to person, place, and time. A sensory deficit (right foot numbness) is present.  Skin: Skin is warm and dry.  Psychiatric: She has a normal mood and affect.      Labs:  Estimated body mass index is 31.97 kg/m as calculated from the following:   Height as of 10/08/11: 5' 1.5" (1.562 m).   Weight as of 10/08/11: 78 kg (172 lb).   Imaging Review Plain radiographs demonstrate severe degenerative joint disease of the right hip. The bone quality appears to be good for age and reported activity level.    Preoperative templating of the  joint replacement has been completed, documented, and submitted to the Operating Room personnel in order to optimize intra-operative equipment management.    Patient's anticipated LOS is less than 2 midnights, meeting these requirements: - Lives within 1 hour of care - Has a competent adult at home to recover with post-op recover - NO history of  - Chronic pain requiring opiods  - Diabetes  - Coronary Artery Disease  - Heart failure  - Heart attack  - Stroke  - DVT/VTE  - Cardiac arrhythmia  - Respiratory Failure/COPD  - Renal failure  - Anemia  - Advanced Liver disease        Assessment/Plan:  End stage arthritis, right hip  The patient history, physical examination, clinical judgement of the provider and imaging studies are consistent with end stage degenerative joint disease of the right hip(s) and total hip arthroplasty is deemed medically necessary. The treatment options including medical management, injection therapy, arthroscopy and arthroplasty were discussed at length. The risks and benefits of total hip arthroplasty were presented and reviewed. The risks due to aseptic loosening, infection, stiffness, dislocation/subluxation,  thromboembolic complications and other imponderables were discussed.  The patient acknowledged the explanation, agreed to proceed with the plan and consent was signed. Patient is being admitted for  inpatient treatment for surgery, pain control, PT, OT, prophylactic antibiotics, VTE prophylaxis, progressive ambulation and ADL's and discharge planning.The patient is planning to be discharged home.     Anastasio AuerbachMatthew S. Shay Jhaveri   PA-C  08/23/2017, 12:35 PM

## 2017-08-26 ENCOUNTER — Ambulatory Visit (INDEPENDENT_AMBULATORY_CARE_PROVIDER_SITE_OTHER): Payer: Medicare Other | Admitting: Podiatry

## 2017-08-26 DIAGNOSIS — E1151 Type 2 diabetes mellitus with diabetic peripheral angiopathy without gangrene: Secondary | ICD-10-CM | POA: Diagnosis not present

## 2017-08-26 DIAGNOSIS — B351 Tinea unguium: Secondary | ICD-10-CM | POA: Diagnosis not present

## 2017-08-26 NOTE — Progress Notes (Signed)
  Subjective:  Patient ID: Sandra Harvey, female    DOB: 01-04-1941,  MRN: [id number]  Chief Complaint  Patient presents with  . debride    B/L debridement -sugar: " I don't have to take it anymore" A1C: 7 PCP: Pharr LOV: x 1 week   77 y.o. female returns for diabetic foot care.  States that she does not check her sugars anymore.  Less[place] her PCP Dr. Renne CriglerPharr 1 week ago.  Denies nausea vomiting fever chills.  Objective:   General AA&O x3. Normal mood and affect.  Vascular Dorsalis pedis pulses present 1+ bilaterally  Posterior tibial pulses absent bilaterally  Capillary refill normal to all digits. Pedal hair growth normal.  Neurologic Epicritic sensation present bilaterally. Protective sensation with 5.07 monofilament  present bilaterally. Vibratory sensation present bilaterally.  Dermatologic No open lesions. Interspaces clear of maceration.  Normal skin temperature and turgor. Hyperkeratotic lesions: None bilaterally. Nails: brittle, onychomycosis, thickening, elongation  Orthopedic: No history of amputation. MMT 5/5 in dorsiflexion, plantarflexion, inversion, and eversion. Normal lower extremity joint ROM without pain or crepitus.   Assessment & Plan:  Patient was evaluated and treated and all questions answered.  Diabetes with PAD, Onychomycosis -Educated on diabetic footcare. Diabetic risk level 1 -Nails x10 debrided sharply and manually with large nail nipper and rotary burr.   Procedure: Nail Debridement Rationale: Patient meets criteria for routine foot care due to Class B findings. Type of Debridement: manual, sharp debridement. Instrumentation: Nail nipper, rotary burr. Number of Nails: 10     Return in about 3 months (around 11/25/2017).

## 2017-09-09 NOTE — Patient Instructions (Signed)
Kristine RoyalBetty W Awan  09/09/2017   Your procedure is scheduled on: 09/17/2017   [facility]  Report to admitting at     0530 AM    Call this number if you have problems the morning of surgery [phone]   Remember: Do not eat food or drink liquids :After Midnight.     Take these medicines the morning of surgery with A SIP OF WATER: Synthroid, Protonix, Zoloft, Amlodipine ( Norvasc), Zyrtec if needed, Dexilant  DO NOT TAKE ANY DIABETIC MEDICATIONS DAY OF YOUR SURGERY                               You may not have any metal on your body including hair pins and              piercings  Do not wear jewelry, make-up, lotions, powders or perfumes, deodorant             Do not wear nail polish.  Do not shave  48 hours prior to surgery.     Do not bring valuables to the hospital. [facility] IS NOT             RESPONSIBLE   FOR VALUABLES.  Contacts, dentures or bridgework may not be worn into surgery.  Leave suitcase in the car. After surgery it may be brought to your room.                  Please read over the following fact sheets you were given: _____________________________________________________________________             [facility] - Preparing for Surgery Before surgery, you can play an important role.  Because skin is not sterile, your skin needs to be as free of germs as possible.  You can reduce the number of germs on your skin by washing with CHG (chlorahexidine gluconate) soap before surgery.  CHG is an antiseptic cleaner which kills germs and bonds with the skin to continue killing germs even after washing. Please DO NOT use if you have an allergy to CHG or antibacterial soaps.  If your skin becomes reddened/irritated stop using the CHG and inform your nurse when you arrive at Short Stay. Do not shave (including legs and underarms) for at least 48 hours prior to the first CHG shower.  You may shave your face/neck. Please follow  these instructions carefully:  1.  Shower with CHG Soap the night before surgery and the  morning of Surgery.  2.  If you choose to wash your hair, wash your hair first as usual with your  normal  shampoo.  3.  After you shampoo, rinse your hair and body thoroughly to remove the  shampoo.                           4.  Use CHG as you would any other liquid soap.  You can apply chg directly  to the skin and wash                       Gently with a scrungie or clean washcloth.  5.  Apply the CHG Soap to your body ONLY FROM THE NECK DOWN.   Do not use on face/ open  Wound or open sores. Avoid contact with eyes, ears mouth and genitals (private parts).                       Wash face,  Genitals (private parts) with your normal soap.             6.  Wash thoroughly, paying special attention to the area where your surgery  will be performed.  7.  Thoroughly rinse your body with warm water from the neck down.  8.  DO NOT shower/wash with your normal soap after using and rinsing off  the CHG Soap.                9.  Pat yourself dry with a clean towel.            10.  Wear clean pajamas.            11.  Place clean sheets on your bed the night of your first shower and do not  sleep with pets. Day of Surgery : Do not apply any lotions/deodorants the morning of surgery.  Please wear clean clothes to the hospital/surgery center.  FAILURE TO FOLLOW THESE INSTRUCTIONS MAY RESULT IN THE CANCELLATION OF YOUR SURGERY PATIENT SIGNATURE_________________________________  NURSE SIGNATURE__________________________________  ________________________________________________________________________  WHAT IS A BLOOD TRANSFUSION? Blood Transfusion Information  A transfusion is the replacement of blood or some of its parts. Blood is made up of multiple cells which provide different functions.  Red blood cells carry oxygen and are used for blood loss replacement.  White blood cells fight  against infection.  Platelets control bleeding.  Plasma helps clot blood.  Other blood products are available for specialized needs, such as hemophilia or other clotting disorders. BEFORE THE TRANSFUSION  Who gives blood for transfusions?   Healthy volunteers who are fully evaluated to make sure their blood is safe. This is blood bank blood. Transfusion therapy is the safest it has ever been in the practice of medicine. Before blood is taken from a donor, a complete history is taken to make sure that person has no history of diseases nor engages in risky social behavior (examples are intravenous drug use or sexual activity with multiple partners). The donor's travel history is screened to minimize risk of transmitting infections, such as malaria. The donated blood is tested for signs of infectious diseases, such as HIV and hepatitis. The blood is then tested to be sure it is compatible with you in order to minimize the chance of a transfusion reaction. If you or a relative donates blood, this is often done in anticipation of surgery and is not appropriate for emergency situations. It takes many days to process the donated blood. RISKS AND COMPLICATIONS Although transfusion therapy is very safe and saves many lives, the main dangers of transfusion include:   Getting an infectious disease.  Developing a transfusion reaction. This is an allergic reaction to something in the blood you were given. Every precaution is taken to prevent this. The decision to have a blood transfusion has been considered carefully by your caregiver before blood is given. Blood is not given unless the benefits outweigh the risks. AFTER THE TRANSFUSION  Right after receiving a blood transfusion, you will usually feel much better and more energetic. This is especially true if your red blood cells have gotten low (anemic). The transfusion raises the level of the red blood cells which carry oxygen, and this usually causes an  energy increase.  The nurse administering the transfusion will monitor you carefully for complications. HOME CARE INSTRUCTIONS  No special instructions are needed after a transfusion. You may find your energy is better. Speak with your caregiver about any limitations on activity for underlying diseases you may have. SEEK MEDICAL CARE IF:   Your condition is not improving after your transfusion.  You develop redness or irritation at the intravenous (IV) site. SEEK IMMEDIATE MEDICAL CARE IF:  Any of the following symptoms occur over the next 12 hours:  Shaking chills.  You have a temperature by mouth above 102 F (38.9 C), not controlled by medicine.  Chest, back, or muscle pain.  People around you feel you are not acting correctly or are confused.  Shortness of breath or difficulty breathing.  Dizziness and fainting.  You get a rash or develop hives.  You have a decrease in urine output.  Your urine turns a dark color or changes to pink, red, or brown. Any of the following symptoms occur over the next 10 days:  You have a temperature by mouth above 102 F (38.9 C), not controlled by medicine.  Shortness of breath.  Weakness after normal activity.  The white part of the eye turns yellow (jaundice).  You have a decrease in the amount of urine or are urinating less often.  Your urine turns a dark color or changes to pink, red, or brown. Document Released: 05/04/2000 Document Revised: 07/30/2011 Document Reviewed: 12/22/2007 ExitCare Patient Information 2014 [place].  _______________________________________________________________________  Incentive Spirometer  An incentive spirometer is a tool that can help keep your lungs clear and active. This tool measures how well you are filling your lungs with each breath. Taking long deep breaths may help reverse or decrease the chance of developing breathing (pulmonary) problems (especially infection) following:  A  long period of time when you are unable to move or be active. BEFORE THE PROCEDURE   If the spirometer includes an indicator to show your best effort, your nurse or respiratory therapist will set it to a desired goal.  If possible, sit up straight or lean slightly forward. Try not to slouch.  Hold the incentive spirometer in an upright position. INSTRUCTIONS FOR USE  1. Sit on the edge of your bed if possible, or sit up as far as you can in bed or on a chair. 2. Hold the incentive spirometer in an upright position. 3. Breathe out normally. 4. Place the mouthpiece in your mouth and seal your lips tightly around it. 5. Breathe in slowly and as deeply as possible, raising the piston or the ball toward the top of the column. 6. Hold your breath for 3-5 seconds or for as long as possible. Allow the piston or ball to fall to the bottom of the column. 7. Remove the mouthpiece from your mouth and breathe out normally. 8. Rest for a few seconds and repeat Steps 1 through 7 at least 10 times every 1-2 hours when you are awake. Take your time and take a few normal breaths between deep breaths. 9. The spirometer may include an indicator to show your best effort. Use the indicator as a goal to work toward during each repetition. 10. After each set of 10 deep breaths, practice coughing to be sure your lungs are clear. If you have an incision (the cut made at the time of surgery), support your incision when coughing by placing a pillow or rolled up towels firmly against it. Once you are able to get  out of bed, walk around indoors and cough well. You may stop using the incentive spirometer when instructed by your caregiver.  RISKS AND COMPLICATIONS  Take your time so you do not get dizzy or light-headed.  If you are in pain, you may need to take or ask for pain medication before doing incentive spirometry. It is harder to take a deep breath if you are having pain. AFTER USE  Rest and breathe slowly and  easily.  It can be helpful to keep track of a log of your progress. Your caregiver can provide you with a simple table to help with this. If you are using the spirometer at home, follow these instructions: [place] IF:   You are having difficultly using the spirometer.  You have trouble using the spirometer as often as instructed.  Your pain medication is not giving enough relief while using the spirometer.  You develop fever of 100.5 F (38.1 C) or higher. SEEK IMMEDIATE MEDICAL CARE IF:   You cough up bloody sputum that had not been present before.  You develop fever of 102 F (38.9 C) or greater.  You develop worsening pain at or near the incision site. MAKE SURE YOU:   Understand these instructions.  Will watch your condition.  Will get help right away if you are not doing well or get worse. Document Released: 09/17/2006 Document Revised: 07/30/2011 Document Reviewed: 11/18/2006 [facility] Patient Information 2014 [place], [place].   ________________________________________________________________________

## 2017-09-11 ENCOUNTER — Encounter (HOSPITAL_COMMUNITY): Payer: Self-pay | Admitting: *Deleted

## 2017-09-11 ENCOUNTER — Other Ambulatory Visit: Payer: Self-pay

## 2017-09-11 ENCOUNTER — Encounter (HOSPITAL_COMMUNITY)
Admission: RE | Admit: 2017-09-11 | Discharge: 2017-09-11 | Disposition: A | Discharge status: Home / Self Care | Payer: Medicare Other | Source: Ambulatory Visit | Attending: Orthopedic Surgery | Admitting: Orthopedic Surgery

## 2017-09-11 DIAGNOSIS — M1611 Unilateral primary osteoarthritis, right hip: Secondary | ICD-10-CM | POA: Insufficient documentation

## 2017-09-11 DIAGNOSIS — E7800 Pure hypercholesterolemia, unspecified: Secondary | ICD-10-CM | POA: Insufficient documentation

## 2017-09-11 DIAGNOSIS — M25551 Pain in right hip: Secondary | ICD-10-CM | POA: Insufficient documentation

## 2017-09-11 DIAGNOSIS — E039 Hypothyroidism, unspecified: Secondary | ICD-10-CM | POA: Diagnosis not present

## 2017-09-11 DIAGNOSIS — D649 Anemia, unspecified: Secondary | ICD-10-CM | POA: Diagnosis not present

## 2017-09-11 DIAGNOSIS — F329 Major depressive disorder, single episode, unspecified: Secondary | ICD-10-CM | POA: Insufficient documentation

## 2017-09-11 DIAGNOSIS — E785 Hyperlipidemia, unspecified: Secondary | ICD-10-CM | POA: Diagnosis not present

## 2017-09-11 DIAGNOSIS — E119 Type 2 diabetes mellitus without complications: Secondary | ICD-10-CM | POA: Diagnosis not present

## 2017-09-11 DIAGNOSIS — I10 Essential (primary) hypertension: Secondary | ICD-10-CM | POA: Insufficient documentation

## 2017-09-11 DIAGNOSIS — Z01812 Encounter for preprocedural laboratory examination: Secondary | ICD-10-CM | POA: Insufficient documentation

## 2017-09-11 HISTORY — DX: Adverse effect of unspecified anesthetic, initial encounter: T41.45XA

## 2017-09-11 HISTORY — DX: Headache: R51

## 2017-09-11 HISTORY — DX: Headache, unspecified: R51.9

## 2017-09-11 HISTORY — DX: Gastric ulcer, unspecified as acute or chronic, without hemorrhage or perforation: K25.9

## 2017-09-11 HISTORY — DX: Other complications of anesthesia, initial encounter: T88.59XA

## 2017-09-11 HISTORY — DX: Anemia, unspecified: D64.9

## 2017-09-11 LAB — CBC
HCT: 37.1 % (ref 36.0–46.0)
Hemoglobin: 11.5 g/dL — ABNORMAL LOW (ref 12.0–15.0)
MCH: 28.6 pg (ref 26.0–34.0)
MCHC: 31 g/dL (ref 30.0–36.0)
MCV: 92.3 fL (ref 78.0–100.0)
Platelets: 364 10*3/uL (ref 150–400)
RBC: 4.02 MIL/uL (ref 3.87–5.11)
RDW: 13.9 % (ref 11.5–15.5)
WBC: 7.6 10*3/uL (ref 4.0–10.5)

## 2017-09-11 LAB — SURGICAL PCR SCREEN
MRSA, PCR: NEGATIVE
Staphylococcus aureus: NEGATIVE

## 2017-09-11 LAB — GLUCOSE, CAPILLARY: Glucose-Capillary: 120 mg/dL — ABNORMAL HIGH (ref 65–99)

## 2017-09-11 LAB — HEMOGLOBIN A1C
Hgb A1c MFr Bld: 5.9 % — ABNORMAL HIGH (ref 4.8–5.6)
Mean Plasma Glucose: 122.63 mg/dL

## 2017-09-11 LAB — BASIC METABOLIC PANEL
Anion gap: 9 (ref 5–15)
BUN: 22 mg/dL — ABNORMAL HIGH (ref 6–20)
CHLORIDE: 103 mmol/L (ref 101–111)
CO2: 27 mmol/L (ref 22–32)
Calcium: 9.1 mg/dL (ref 8.9–10.3)
Creatinine, Ser: 1.06 mg/dL — ABNORMAL HIGH (ref 0.44–1.00)
GFR calc non Af Amer: 49 mL/min — ABNORMAL LOW (ref >60)
GFR, EST AFRICAN AMERICAN: 57 mL/min — AB (ref >60)
Glucose, Bld: 72 mg/dL (ref 65–99)
POTASSIUM: 3.9 mmol/L (ref 3.5–5.1)
Sodium: 139 mmol/L (ref 135–145)

## 2017-09-11 NOTE — Progress Notes (Addendum)
LOV- 08/20/17- Dr Renne CriglerPharr on chart EKG-09/06/14 on chart EKG-08/28/2017 on chart  08/20/17-CBC,Diff, on chart. CMP-08/20/17 on chart  Endoscopy note- 08/29/2017 on chart  Requestesd most recent EKG from office of Dr Renne CriglerPharr and clearance from Dr Edrick OhPhar.  LVMM for Medical Records at office of Dr Renne CriglerPharr.

## 2017-09-11 NOTE — Progress Notes (Signed)
LVMM for Sandra Harvey at office of DR Charlann Boxerlin regarding endo report from Dr Elnoria HowardHung on chart noting recent ulcer diagnosis .  Patient states Dr Elnoria HowardHung was to have notified Dr Charlann Boxerlin.  Patient stated Dr Elnoria HowardHung advised her not to have surgery at this time.

## 2017-09-12 LAB — ABO/RH: ABO/RH(D): O POS

## 2017-09-12 NOTE — Progress Notes (Signed)
Sherry from office fo Dr Charlann Boxerlin called back and stated that Dr Charlann Boxerlin aware of recent endoscopy procedure with ulcer diagnosis.  Sherrie faxed to [facility] the clearance from office of PCP Dr Renne CriglerPharr.  Nurse informed Sherrie that at preop appointment that patient stated " Dr hung told me I should not have this surgery right now, but I am going to have it anyway. ".

## 2017-09-16 MED ORDER — TRANEXAMIC ACID 1000 MG/10ML IV SOLN
1000.0000 mg | INTRAVENOUS | Status: AC
  Administered 2017-09-17: 1000 mg via INTRAVENOUS
  Filled 2017-09-16: qty 1100

## 2017-09-16 NOTE — Progress Notes (Signed)
Patient called and said sister cannot stay overnight with her after surgery tomorrow due to pinched nerve., but she has driver home, patient to notify dr Charlann Boxer office. Patient husband will be in home with patient he has mild dementia.

## 2017-09-17 ENCOUNTER — Inpatient Hospital Stay (HOSPITAL_COMMUNITY): Payer: Medicare Other

## 2017-09-17 ENCOUNTER — Inpatient Hospital Stay (HOSPITAL_COMMUNITY)
Admission: RE | Admit: 2017-09-17 | Discharge: 2017-09-19 | DRG: 470 | Disposition: A | Discharge status: Home Health | Payer: Medicare Other | Source: Ambulatory Visit | Attending: Orthopedic Surgery | Admitting: Orthopedic Surgery

## 2017-09-17 ENCOUNTER — Other Ambulatory Visit: Payer: Self-pay

## 2017-09-17 ENCOUNTER — Inpatient Hospital Stay (HOSPITAL_COMMUNITY): Payer: Medicare Other | Admitting: Anesthesiology

## 2017-09-17 ENCOUNTER — Encounter (HOSPITAL_COMMUNITY)
Admission: RE | Disposition: A | Discharge status: Home Health | Payer: Self-pay | Source: Ambulatory Visit | Attending: Orthopedic Surgery

## 2017-09-17 ENCOUNTER — Encounter (HOSPITAL_COMMUNITY): Payer: Self-pay | Admitting: *Deleted

## 2017-09-17 DIAGNOSIS — M1611 Unilateral primary osteoarthritis, right hip: Secondary | ICD-10-CM | POA: Diagnosis present

## 2017-09-17 DIAGNOSIS — Z79890 Hormone replacement therapy (postmenopausal): Secondary | ICD-10-CM

## 2017-09-17 DIAGNOSIS — Z833 Family history of diabetes mellitus: Secondary | ICD-10-CM

## 2017-09-17 DIAGNOSIS — I10 Essential (primary) hypertension: Secondary | ICD-10-CM | POA: Diagnosis present

## 2017-09-17 DIAGNOSIS — Z9181 History of falling: Secondary | ICD-10-CM | POA: Diagnosis not present

## 2017-09-17 DIAGNOSIS — Z8249 Family history of ischemic heart disease and other diseases of the circulatory system: Secondary | ICD-10-CM | POA: Diagnosis not present

## 2017-09-17 DIAGNOSIS — I2510 Atherosclerotic heart disease of native coronary artery without angina pectoris: Secondary | ICD-10-CM | POA: Diagnosis present

## 2017-09-17 DIAGNOSIS — E663 Overweight: Secondary | ICD-10-CM | POA: Diagnosis present

## 2017-09-17 DIAGNOSIS — E039 Hypothyroidism, unspecified: Secondary | ICD-10-CM | POA: Diagnosis present

## 2017-09-17 DIAGNOSIS — Z7982 Long term (current) use of aspirin: Secondary | ICD-10-CM

## 2017-09-17 DIAGNOSIS — Z87891 Personal history of nicotine dependence: Secondary | ICD-10-CM

## 2017-09-17 DIAGNOSIS — Z6826 Body mass index (BMI) 26.0-26.9, adult: Secondary | ICD-10-CM | POA: Diagnosis not present

## 2017-09-17 DIAGNOSIS — Z96649 Presence of unspecified artificial hip joint: Secondary | ICD-10-CM

## 2017-09-17 DIAGNOSIS — K219 Gastro-esophageal reflux disease without esophagitis: Secondary | ICD-10-CM | POA: Diagnosis present

## 2017-09-17 DIAGNOSIS — Z888 Allergy status to other drugs, medicaments and biological substances status: Secondary | ICD-10-CM

## 2017-09-17 DIAGNOSIS — Z886 Allergy status to analgesic agent status: Secondary | ICD-10-CM | POA: Diagnosis not present

## 2017-09-17 DIAGNOSIS — Z96641 Presence of right artificial hip joint: Secondary | ICD-10-CM

## 2017-09-17 HISTORY — PX: TOTAL HIP ARTHROPLASTY: SHX124

## 2017-09-17 LAB — GLUCOSE, CAPILLARY
GLUCOSE-CAPILLARY: 110 mg/dL — AB (ref 65–99)
GLUCOSE-CAPILLARY: 144 mg/dL — AB (ref 65–99)
Glucose-Capillary: 113 mg/dL — ABNORMAL HIGH (ref 65–99)
Glucose-Capillary: 187 mg/dL — ABNORMAL HIGH (ref 65–99)
Glucose-Capillary: 138 mg/dL — ABNORMAL HIGH (ref 65–99)

## 2017-09-17 LAB — TYPE AND SCREEN
ABO/RH(D): O POS
Antibody Screen: NEGATIVE

## 2017-09-17 SURGERY — ARTHROPLASTY, HIP, TOTAL, ANTERIOR APPROACH
Anesthesia: Spinal | Site: Hip | Laterality: Right

## 2017-09-17 MED ORDER — PHENYLEPHRINE HCL 10 MG/ML IJ SOLN
INTRAMUSCULAR | Status: AC
  Filled 2017-09-17: qty 1

## 2017-09-17 MED ORDER — CEFAZOLIN SODIUM-DEXTROSE 2-4 GM/100ML-% IV SOLN
2.0000 g | Freq: Four times a day (QID) | INTRAVENOUS | Status: AC
  Administered 2017-09-17 (×2): 2 g via INTRAVENOUS
  Filled 2017-09-17 (×2): qty 100

## 2017-09-17 MED ORDER — ONDANSETRON HCL 4 MG/2ML IJ SOLN
INTRAMUSCULAR | Status: DC | PRN
  Administered 2017-09-17: 4 mg via INTRAVENOUS

## 2017-09-17 MED ORDER — BISACODYL 10 MG RE SUPP: 10.0000 mg | Freq: Every day | RECTAL | Status: DC | PRN

## 2017-09-17 MED ORDER — IRBESARTAN 150 MG PO TABS: 300.0000 mg | ORAL_TABLET | Freq: Every day | ORAL | Status: DC

## 2017-09-17 MED ORDER — HYDROCHLOROTHIAZIDE 12.5 MG PO CAPS: 12.5000 mg | ORAL_CAPSULE | Freq: Every day | ORAL | Status: DC

## 2017-09-17 MED ORDER — PHENYLEPHRINE 40 MCG/ML (10ML) SYRINGE FOR IV PUSH (FOR BLOOD PRESSURE SUPPORT)
PREFILLED_SYRINGE | INTRAVENOUS | Status: DC | PRN
  Administered 2017-09-17 (×5): 80 ug via INTRAVENOUS

## 2017-09-17 MED ORDER — POLYETHYLENE GLYCOL 3350 17 G PO PACK
17.0000 g | PACK | Freq: Two times a day (BID) | ORAL | Status: DC
  Administered 2017-09-17 – 2017-09-18 (×2): 17 g via ORAL
  Filled 2017-09-17 (×3): qty 1

## 2017-09-17 MED ORDER — ASPIRIN 81 MG PO CHEW
81.0000 mg | CHEWABLE_TABLET | Freq: Two times a day (BID) | ORAL | Status: DC
  Administered 2017-09-17 – 2017-09-19 (×4): 81 mg via ORAL
  Filled 2017-09-17 (×4): qty 1

## 2017-09-17 MED ORDER — HYDROMORPHONE HCL 1 MG/ML IJ SOLN
INTRAMUSCULAR | Status: AC
  Filled 2017-09-17: qty 1

## 2017-09-17 MED ORDER — CHLORHEXIDINE GLUCONATE 4 % EX LIQD: 60.0000 mL | Freq: Once | CUTANEOUS | Status: DC

## 2017-09-17 MED ORDER — DOCUSATE SODIUM 100 MG PO CAPS
100.0000 mg | ORAL_CAPSULE | Freq: Two times a day (BID) | ORAL | Status: DC
  Administered 2017-09-17 – 2017-09-19 (×4): 100 mg via ORAL
  Filled 2017-09-17 (×4): qty 1

## 2017-09-17 MED ORDER — HYDROCODONE-ACETAMINOPHEN 7.5-325 MG PO TABS: 1.0000 | ORAL_TABLET | ORAL | Status: DC | PRN

## 2017-09-17 MED ORDER — DOCUSATE SODIUM 100 MG PO CAPS: 100.0000 mg | ORAL_CAPSULE | Freq: Two times a day (BID) | ORAL | 0 refills | Status: DC

## 2017-09-17 MED ORDER — IMIPRAMINE HCL 50 MG PO TABS
50.0000 mg | ORAL_TABLET | Freq: Every day | ORAL | Status: DC
  Administered 2017-09-17 – 2017-09-18 (×2): 50 mg via ORAL
  Filled 2017-09-17 (×2): qty 1

## 2017-09-17 MED ORDER — PROPOFOL 10 MG/ML IV BOLUS
INTRAVENOUS | Status: AC
Start: 2017-09-17 — End: ?
  Filled 2017-09-17: qty 60

## 2017-09-17 MED ORDER — ONDANSETRON HCL 4 MG/2ML IJ SOLN: 4.0000 mg | Freq: Four times a day (QID) | INTRAMUSCULAR | Status: DC | PRN

## 2017-09-17 MED ORDER — DEXAMETHASONE SODIUM PHOSPHATE 10 MG/ML IJ SOLN
10.0000 mg | Freq: Once | INTRAMUSCULAR | Status: DC
  Filled 2017-09-17: qty 1

## 2017-09-17 MED ORDER — EPHEDRINE 5 MG/ML INJ
INTRAVENOUS | Status: AC
  Filled 2017-09-17: qty 10

## 2017-09-17 MED ORDER — ACETAMINOPHEN 325 MG PO TABS
325.0000 mg | ORAL_TABLET | Freq: Four times a day (QID) | ORAL | Status: DC | PRN
  Administered 2017-09-18: 650 mg via ORAL
  Filled 2017-09-17: qty 2

## 2017-09-17 MED ORDER — TRAMADOL HCL 50 MG PO TABS
50.0000 mg | ORAL_TABLET | Freq: Four times a day (QID) | ORAL | Status: DC
  Administered 2017-09-17 – 2017-09-19 (×5): 50 mg via ORAL
  Filled 2017-09-17 (×5): qty 1

## 2017-09-17 MED ORDER — MEPERIDINE HCL 50 MG/ML IJ SOLN: 6.2500 mg | INTRAMUSCULAR | Status: DC | PRN

## 2017-09-17 MED ORDER — FENTANYL CITRATE (PF) 100 MCG/2ML IJ SOLN
INTRAMUSCULAR | Status: DC | PRN
  Administered 2017-09-17 (×2): 25 ug via INTRAVENOUS
  Administered 2017-09-17: 50 ug via INTRAVENOUS

## 2017-09-17 MED ORDER — HYDROMORPHONE HCL 1 MG/ML IJ SOLN
0.2500 mg | INTRAMUSCULAR | Status: DC | PRN
  Administered 2017-09-17 (×2): 0.5 mg via INTRAVENOUS

## 2017-09-17 MED ORDER — TRANEXAMIC ACID 1000 MG/10ML IV SOLN
1000.0000 mg | Freq: Once | INTRAVENOUS | Status: AC
  Administered 2017-09-17: 1000 mg via INTRAVENOUS
  Filled 2017-09-17: qty 1100

## 2017-09-17 MED ORDER — METOCLOPRAMIDE HCL 5 MG/ML IJ SOLN: 5.0000 mg | Freq: Three times a day (TID) | INTRAMUSCULAR | Status: DC | PRN

## 2017-09-17 MED ORDER — PROPOFOL 10 MG/ML IV BOLUS
INTRAVENOUS | Status: AC
  Filled 2017-09-17: qty 60

## 2017-09-17 MED ORDER — HYDROCODONE-ACETAMINOPHEN 5-325 MG PO TABS: 1.0000 | ORAL_TABLET | ORAL | Status: DC | PRN

## 2017-09-17 MED ORDER — MAGNESIUM CITRATE PO SOLN: 1.0000 | Freq: Once | ORAL | Status: DC | PRN

## 2017-09-17 MED ORDER — PROPOFOL 10 MG/ML IV BOLUS
INTRAVENOUS | Status: DC | PRN
  Administered 2017-09-17: 20 mg via INTRAVENOUS

## 2017-09-17 MED ORDER — PHENOL 1.4 % MT LIQD: 1.0000 | OROMUCOSAL | Status: DC | PRN

## 2017-09-17 MED ORDER — ROSUVASTATIN CALCIUM 20 MG PO TABS
20.0000 mg | ORAL_TABLET | Freq: Every day | ORAL | Status: DC
  Administered 2017-09-17 – 2017-09-18 (×2): 20 mg via ORAL
  Filled 2017-09-17 (×3): qty 1

## 2017-09-17 MED ORDER — FERROUS SULFATE 325 (65 FE) MG PO TABS: 325.0000 mg | ORAL_TABLET | Freq: Three times a day (TID) | ORAL | 3 refills | Status: DC

## 2017-09-17 MED ORDER — PHENYLEPHRINE 40 MCG/ML (10ML) SYRINGE FOR IV PUSH (FOR BLOOD PRESSURE SUPPORT)
PREFILLED_SYRINGE | INTRAVENOUS | Status: AC
  Filled 2017-09-17: qty 10

## 2017-09-17 MED ORDER — MENTHOL 3 MG MT LOZG: 1.0000 | LOZENGE | OROMUCOSAL | Status: DC | PRN

## 2017-09-17 MED ORDER — METOCLOPRAMIDE HCL 5 MG PO TABS: 5.0000 mg | ORAL_TABLET | Freq: Three times a day (TID) | ORAL | Status: DC | PRN

## 2017-09-17 MED ORDER — FENTANYL CITRATE (PF) 100 MCG/2ML IJ SOLN
INTRAMUSCULAR | Status: AC
  Filled 2017-09-17: qty 2

## 2017-09-17 MED ORDER — LORATADINE 10 MG PO TABS
10.0000 mg | ORAL_TABLET | Freq: Every day | ORAL | Status: DC
  Administered 2017-09-18 – 2017-09-19 (×2): 10 mg via ORAL
  Filled 2017-09-17 (×3): qty 1

## 2017-09-17 MED ORDER — METFORMIN HCL ER 500 MG PO TB24
500.0000 mg | ORAL_TABLET | Freq: Two times a day (BID) | ORAL | Status: DC
  Administered 2017-09-17 – 2017-09-19 (×4): 500 mg via ORAL
  Filled 2017-09-17 (×4): qty 1

## 2017-09-17 MED ORDER — ASPIRIN 81 MG PO CHEW: 81.0000 mg | CHEWABLE_TABLET | Freq: Two times a day (BID) | ORAL | 0 refills | Status: AC

## 2017-09-17 MED ORDER — PANTOPRAZOLE SODIUM 40 MG PO TBEC
40.0000 mg | DELAYED_RELEASE_TABLET | Freq: Every day | ORAL | Status: DC
  Administered 2017-09-18 – 2017-09-19 (×2): 40 mg via ORAL
  Filled 2017-09-17 (×2): qty 1

## 2017-09-17 MED ORDER — 0.9 % SODIUM CHLORIDE (POUR BTL) OPTIME
TOPICAL | Status: DC | PRN
  Administered 2017-09-17: 1000 mL

## 2017-09-17 MED ORDER — HYDROCODONE-ACETAMINOPHEN 7.5-325 MG PO TABS: 1.0000 | ORAL_TABLET | ORAL | 0 refills | Status: DC | PRN

## 2017-09-17 MED ORDER — METHOCARBAMOL 500 MG PO TABS
500.0000 mg | ORAL_TABLET | Freq: Four times a day (QID) | ORAL | Status: DC | PRN
  Administered 2017-09-18: 500 mg via ORAL
  Filled 2017-09-17: qty 1

## 2017-09-17 MED ORDER — CEFAZOLIN SODIUM-DEXTROSE 2-4 GM/100ML-% IV SOLN
2.0000 g | INTRAVENOUS | Status: AC
  Administered 2017-09-17: 2 g via INTRAVENOUS
  Filled 2017-09-17: qty 100

## 2017-09-17 MED ORDER — MIDAZOLAM HCL 2 MG/2ML IJ SOLN
INTRAMUSCULAR | Status: AC
  Filled 2017-09-17: qty 2

## 2017-09-17 MED ORDER — METHOCARBAMOL 1000 MG/10ML IJ SOLN
500.0000 mg | Freq: Four times a day (QID) | INTRAVENOUS | Status: DC | PRN
  Filled 2017-09-17: qty 5

## 2017-09-17 MED ORDER — ALUM & MAG HYDROXIDE-SIMETH 200-200-20 MG/5ML PO SUSP: 15.0000 mL | ORAL | Status: DC | PRN

## 2017-09-17 MED ORDER — ONDANSETRON HCL 4 MG/2ML IJ SOLN: 4.0000 mg | Freq: Once | INTRAMUSCULAR | Status: DC | PRN

## 2017-09-17 MED ORDER — LEVOTHYROXINE SODIUM 88 MCG PO TABS
88.0000 ug | ORAL_TABLET | Freq: Every day | ORAL | Status: DC
  Administered 2017-09-18 – 2017-09-19 (×2): 88 ug via ORAL
  Filled 2017-09-17 (×2): qty 1

## 2017-09-17 MED ORDER — MIDAZOLAM HCL 5 MG/5ML IJ SOLN
INTRAMUSCULAR | Status: DC | PRN
  Administered 2017-09-17: 2 mg via INTRAVENOUS

## 2017-09-17 MED ORDER — DEXAMETHASONE SODIUM PHOSPHATE 10 MG/ML IJ SOLN
INTRAMUSCULAR | Status: AC
  Filled 2017-09-17: qty 2

## 2017-09-17 MED ORDER — AMLODIPINE BESYLATE 5 MG PO TABS: 2.5000 mg | ORAL_TABLET | Freq: Every day | ORAL | Status: DC

## 2017-09-17 MED ORDER — FERROUS SULFATE 325 (65 FE) MG PO TABS
325.0000 mg | ORAL_TABLET | Freq: Three times a day (TID) | ORAL | Status: DC
  Administered 2017-09-18 (×2): 325 mg via ORAL
  Filled 2017-09-17 (×2): qty 1

## 2017-09-17 MED ORDER — SERTRALINE HCL 100 MG PO TABS
100.0000 mg | ORAL_TABLET | Freq: Every day | ORAL | Status: DC
  Administered 2017-09-18 – 2017-09-19 (×2): 100 mg via ORAL
  Filled 2017-09-17 (×2): qty 1

## 2017-09-17 MED ORDER — PHENYLEPHRINE HCL 10 MG/ML IJ SOLN
INTRAVENOUS | Status: DC | PRN
  Administered 2017-09-17: 40 ug/min via INTRAVENOUS

## 2017-09-17 MED ORDER — BACLOFEN 10 MG PO TABS: 10.0000 mg | ORAL_TABLET | Freq: Three times a day (TID) | ORAL | Status: DC | PRN

## 2017-09-17 MED ORDER — MORPHINE SULFATE (PF) 2 MG/ML IV SOLN: 0.5000 mg | INTRAVENOUS | Status: DC | PRN

## 2017-09-17 MED ORDER — DEXAMETHASONE SODIUM PHOSPHATE 10 MG/ML IJ SOLN
10.0000 mg | Freq: Once | INTRAMUSCULAR | Status: AC
  Administered 2017-09-17: 10 mg via INTRAVENOUS

## 2017-09-17 MED ORDER — DIPHENHYDRAMINE HCL 12.5 MG/5ML PO ELIX: 12.5000 mg | ORAL_SOLUTION | ORAL | Status: DC | PRN

## 2017-09-17 MED ORDER — STERILE WATER FOR IRRIGATION IR SOLN
Status: DC | PRN
  Administered 2017-09-17: 2000 mL

## 2017-09-17 MED ORDER — LACTATED RINGERS IV SOLN
INTRAVENOUS | Status: DC | PRN
  Administered 2017-09-17 (×2): via INTRAVENOUS

## 2017-09-17 MED ORDER — ONDANSETRON HCL 4 MG/2ML IJ SOLN
INTRAMUSCULAR | Status: AC
  Filled 2017-09-17: qty 4

## 2017-09-17 MED ORDER — EPHEDRINE SULFATE-NACL 50-0.9 MG/10ML-% IV SOSY
PREFILLED_SYRINGE | INTRAVENOUS | Status: DC | PRN
  Administered 2017-09-17 (×3): 5 mg via INTRAVENOUS

## 2017-09-17 MED ORDER — POLYETHYLENE GLYCOL 3350 17 G PO PACK: 17.0000 g | PACK | Freq: Two times a day (BID) | ORAL | 0 refills | Status: DC

## 2017-09-17 MED ORDER — CYCLOBENZAPRINE HCL 5 MG PO TABS: 5.0000 mg | ORAL_TABLET | Freq: Three times a day (TID) | ORAL | 0 refills | Status: DC | PRN

## 2017-09-17 MED ORDER — INSULIN ASPART 100 UNIT/ML ~~LOC~~ SOLN
0.0000 [IU] | Freq: Three times a day (TID) | SUBCUTANEOUS | Status: DC
  Administered 2017-09-18: 1 [IU] via SUBCUTANEOUS

## 2017-09-17 MED ORDER — BUPIVACAINE HCL (PF) 0.5 % IJ SOLN
INTRAMUSCULAR | Status: DC | PRN
  Administered 2017-09-17: 3 mL via INTRATHECAL

## 2017-09-17 MED ORDER — ONDANSETRON HCL 4 MG PO TABS: 4.0000 mg | ORAL_TABLET | Freq: Four times a day (QID) | ORAL | Status: DC | PRN

## 2017-09-17 MED ORDER — SODIUM CHLORIDE 0.9 % IV SOLN
INTRAVENOUS | Status: DC
  Administered 2017-09-17 – 2017-09-18 (×2): via INTRAVENOUS

## 2017-09-17 MED ORDER — PROPOFOL 500 MG/50ML IV EMUL
INTRAVENOUS | Status: DC | PRN
  Administered 2017-09-17: 55 ug/kg/min via INTRAVENOUS

## 2017-09-17 SURGICAL SUPPLY — 47 items
ADH SKN CLS APL DERMABOND .7 (GAUZE/BANDAGES/DRESSINGS) ×1
BAG SPEC THK2 15X12 ZIP CLS (MISCELLANEOUS) ×1
BAG ZIPLOCK 12X15 (MISCELLANEOUS) ×2 IMPLANT
BLADE SAG 18X100X1.27 (BLADE) ×3 IMPLANT
BNDG CONFORM 3 STRL LF (GAUZE/BANDAGES/DRESSINGS) ×2 IMPLANT
CAPT HIP TOTAL 2 ×2 IMPLANT
CLOTH BEACON ORANGE TIMEOUT ST (SAFETY) ×3 IMPLANT
COVER PERINEAL POST (MISCELLANEOUS) ×3 IMPLANT
COVER SURGICAL LIGHT HANDLE (MISCELLANEOUS) ×3 IMPLANT
DERMABOND ADVANCED (GAUZE/BANDAGES/DRESSINGS) ×2
DERMABOND ADVANCED .7 DNX12 (GAUZE/BANDAGES/DRESSINGS) ×1 IMPLANT
DRAPE STERI IOBAN 125X83 (DRAPES) ×3 IMPLANT
DRAPE U-SHAPE 47X51 STRL (DRAPES) ×6 IMPLANT
DRESSING AQUACEL AG SP 3.5X10 (GAUZE/BANDAGES/DRESSINGS) ×1 IMPLANT
DRSG AQUACEL AG SP 3.5X10 (GAUZE/BANDAGES/DRESSINGS) ×3
DURAPREP 26ML APPLICATOR (WOUND CARE) ×3 IMPLANT
ELECT REM PT RETURN 15FT ADLT (MISCELLANEOUS) ×3 IMPLANT
GAUZE SPONGE 4X4 12PLY STRL (GAUZE/BANDAGES/DRESSINGS) ×2 IMPLANT
GAUZE XEROFORM 1X8 LF (GAUZE/BANDAGES/DRESSINGS) ×2 IMPLANT
GLOVE BIO SURGEON STRL SZ 6.5 (GLOVE) ×1 IMPLANT
GLOVE BIO SURGEONS STRL SZ 6.5 (GLOVE) ×1
GLOVE BIOGEL PI IND STRL 6.5 (GLOVE) IMPLANT
GLOVE BIOGEL PI IND STRL 7.0 (GLOVE) IMPLANT
GLOVE BIOGEL PI IND STRL 7.5 (GLOVE) ×1 IMPLANT
GLOVE BIOGEL PI IND STRL 8.5 (GLOVE) ×1 IMPLANT
GLOVE BIOGEL PI INDICATOR 6.5 (GLOVE) ×2
GLOVE BIOGEL PI INDICATOR 7.0 (GLOVE) ×2
GLOVE BIOGEL PI INDICATOR 7.5 (GLOVE) ×8
GLOVE BIOGEL PI INDICATOR 8.5 (GLOVE) ×2
GLOVE ECLIPSE 8.0 STRL XLNG CF (GLOVE) ×8 IMPLANT
GLOVE ORTHO TXT STRL SZ7.5 (GLOVE) ×3 IMPLANT
GLOVE SURG SS PI 7.0 STRL IVOR (GLOVE) ×2 IMPLANT
GLOVE SURG SS PI 7.5 STRL IVOR (GLOVE) ×4 IMPLANT
GOWN SPEC L3 XXLG W/TWL (GOWN DISPOSABLE) ×4 IMPLANT
GOWN STRL REUS W/TWL LRG LVL3 (GOWN DISPOSABLE) ×5 IMPLANT
GOWN STRL REUS W/TWL XL LVL3 (GOWN DISPOSABLE) ×2 IMPLANT
HOLDER FOLEY CATH W/STRAP (MISCELLANEOUS) ×3 IMPLANT
PACK ANTERIOR HIP CUSTOM (KITS) ×3 IMPLANT
SUT MNCRL AB 4-0 PS2 18 (SUTURE) ×3 IMPLANT
SUT STRATAFIX 0 PDS 27 VIOLET (SUTURE) ×3
SUT VIC AB 1 CT1 36 (SUTURE) ×9 IMPLANT
SUT VIC AB 2-0 CT1 27 (SUTURE) ×6
SUT VIC AB 2-0 CT1 TAPERPNT 27 (SUTURE) ×2 IMPLANT
SUTURE STRATFX 0 PDS 27 VIOLET (SUTURE) ×1 IMPLANT
TAPE CLOTH SURG 4X10 WHT LF (GAUZE/BANDAGES/DRESSINGS) ×2 IMPLANT
TRAY FOLEY CATH 14FR (SET/KITS/TRAYS/PACK) ×2 IMPLANT
YANKAUER SUCT BULB TIP 10FT TU (MISCELLANEOUS) ×2 IMPLANT

## 2017-09-17 NOTE — Anesthesia Preprocedure Evaluation (Signed)
Anesthesia Evaluation  Patient identified by MRN, date of birth, ID band Patient awake    Reviewed: Allergy & Precautions, NPO status , Patient's Chart, lab work & pertinent test results  Airway Mallampati: I  TM Distance: >3 FB Neck ROM: Full    Dental   Pulmonary former smoker,    Pulmonary exam normal        Cardiovascular hypertension, Pt. on medications Normal cardiovascular exam     Neuro/Psych Anxiety Depression    GI/Hepatic GERD  Medicated and Controlled,  Endo/Other  diabetes, Type 2, Oral Hypoglycemic Agents  Renal/GU      Musculoskeletal   Abdominal   Peds  Hematology   Anesthesia Other Findings   Reproductive/Obstetrics                             Anesthesia Physical Anesthesia Plan  ASA: II  Anesthesia Plan: Spinal   Post-op Pain Management:    Induction: Intravenous  PONV Risk Score and Plan: 2 and Ondansetron and Treatment may vary due to age or medical condition  Airway Management Planned: Simple Face Mask  Additional Equipment:   Intra-op Plan:   Post-operative Plan:   Informed Consent: I have reviewed the patients History and Physical, chart, labs and discussed the procedure including the risks, benefits and alternatives for the proposed anesthesia with the patient or authorized representative who has indicated his/her understanding and acceptance.     Plan Discussed with: CRNA and Surgeon  Anesthesia Plan Comments:         Anesthesia Quick Evaluation

## 2017-09-17 NOTE — Evaluation (Signed)
Physical Therapy Evaluation Patient Details Name: Sandra Harvey MRN: [id number] DOB: [birth year] Today's Date: 09/17/2017   History of Present Illness   77 yo female s/p R THA-direct anterior 09/17/17. Pt also has L hand swelling with dressing in place. Pt reported cat scratch prior to admission.     Clinical Impression  On eval POD 0, pt required Min assist for mobility. She walked ~50 feet with a RW. Mild pain with activity. Mobility is somewhat limited due to L hand cat scratch with dressing in place. Per pt, plan is to d/c home. Will follow and progress activity as tolerated. Feel pt may benefit from HHPT f/u. Pt has limited assistance at home (husband nearly fell in room during PT eval).     Follow Up Recommendations Follow surgeon's recommendation for DC plan and follow-up therapies(feel pt could benefit from HHPT f/u. Limited assistance as home)    Equipment Recommendations  None recommended by PT    Recommendations for Other Services       Precautions / Restrictions Precautions Precautions: Fall Restrictions Weight Bearing Restrictions: No Other Position/Activity Restrictions: WBAT      Mobility  Bed Mobility Overal bed mobility: Needs Assistance Bed Mobility: Supine to Sit     Supine to sit: Min assist;HOB elevated     General bed mobility comments: Assist for R LE. Increased time and effort. Poor use of L hand/UE due to swelling, pain from cat scratch.   Transfers Overall transfer level: Needs assistance Equipment used: Rolling walker (2 wheeled) Transfers: Sit to/from Stand Sit to Stand: Min assist         General transfer comment: Assist to rise, stabilize, control descent. VCs safety, technique, hand/LE placement. Increased time.   Ambulation/Gait Ambulation/Gait assistance: Min assist Ambulation Distance (Feet): 50 Feet Assistive device: Rolling walker (2 wheeled) Gait Pattern/deviations: Step-to pattern;Step-through pattern;Decreased stride  length     General Gait Details: Assist to stabilize throughout ambulation. VCs safety, sequence, technique.   Stairs            Wheelchair Mobility    Modified Rankin (Stroke Patients Only)       Balance                                             Pertinent Vitals/Pain Pain Assessment: 0-10 Pain Score: 5  Pain Location: R thigh/groin Pain Descriptors / Indicators: Sore;Tightness;Discomfort Pain Intervention(s): Monitored during session;Repositioned    Home Living Family/patient expects to be discharged to:: Private residence Living Arrangements: Spouse/significant other Available Help at Discharge: Family Type of Home: House Home Access: Stairs to enter Entrance Stairs-Rails: Right Entrance Stairs-Number of Steps: 3 Home Layout: One level Home Equipment: Environmental consultant - 2 wheels;Walker - 4 wheels;Cane - single point;Tub bench      Prior Function Level of Independence: Independent               Hand Dominance        Extremity/Trunk Assessment   Upper Extremity Assessment Upper Extremity Assessment: RUE deficits/detail RUE Deficits / Details: R hand swelling, dressing in place    Lower Extremity Assessment Lower Extremity Assessment: Generalized weakness(s/p R THA)    Cervical / Trunk Assessment Cervical / Trunk Assessment: Normal  Communication   Communication: No difficulties  Cognition Arousal/Alertness: Awake/alert Behavior During Therapy: WFL for tasks assessed/performed Overall Cognitive Status: Within Functional Limits for tasks assessed  General Comments      Exercises     Assessment/Plan    PT Assessment Patient needs continued PT services  PT Problem List Decreased strength;Decreased balance;Decreased mobility;Decreased activity tolerance       PT Treatment Interventions DME instruction;Gait training;Functional mobility training;Therapeutic  activities;Balance training;Patient/family education;Therapeutic exercise;Stair training    PT Goals (Current goals can be found in the Care Plan section)  Acute Rehab PT Goals Patient Stated Goal: less pain. regain independence PT Goal Formulation: With patient/family Time For Goal Achievement: 10/01/17 Potential to Achieve Goals: Good    Frequency 7X/week   Barriers to discharge        Co-evaluation               AM-PAC PT "6 Clicks" Daily Activity  Outcome Measure Difficulty turning over in bed (including adjusting bedclothes, sheets and blankets)?: A Lot Difficulty moving from lying on back to sitting on the side of the bed? : Unable Difficulty sitting down on and standing up from a chair with arms (e.g., wheelchair, bedside commode, etc,.)?: Unable Help needed moving to and from a bed to chair (including a wheelchair)?: A Little Help needed walking in hospital room?: A Little Help needed climbing 3-5 steps with a railing? : A Little 6 Click Score: 13    End of Session Equipment Utilized During Treatment: Gait belt Activity Tolerance: Patient tolerated treatment well Patient left: in chair;with call bell/phone within reach;with family/visitor present   PT Visit Diagnosis: Pain;Difficulty in walking, not elsewhere classified (R26.2);Muscle weakness (generalized) (M62.81);Other abnormalities of gait and mobility (R26.89) Pain - Right/Left: Right Pain - part of body: Hip    Time: 1610-9604 PT Time Calculation (min) (ACUTE ONLY): 24 min   Charges:   PT Evaluation $PT Eval Low Complexity: 1 Low PT Treatments $Gait Training: 8-22 mins   PT G Codes:         Rebeca Alert, MPT Pager: [phone]

## 2017-09-17 NOTE — Interval H&P Note (Signed)
History and Physical Interval Note:  09/17/2017 7:01 AM  Sandra Harvey  has presented today for surgery, with the diagnosis of Right hip osteoarthritis  The various methods of treatment have been discussed with the patient and family. After consideration of risks, benefits and other options for treatment, the patient has consented to  Procedure(s) with comments: RIGHT TOTAL HIP ARTHROPLASTY ANTERIOR APPROACH (Right) - 70 mins as a surgical intervention .  The patient's history has been reviewed, patient examined, no change in status, stable for surgery.  I have reviewed the patient's chart and labs.  Questions were answered to the patient's satisfaction.     Shelda Pal

## 2017-09-17 NOTE — Anesthesia Procedure Notes (Signed)
Spinal  Start time: 09/17/2017 7:18 AM End time: 09/17/2017 7:20 AM Staffing Anesthesiologist: Arta Bruce, MD Performed: anesthesiologist  Preanesthetic Checklist Completed: patient identified, surgical consent, pre-op evaluation, timeout performed, IV checked, risks and benefits discussed and monitors and equipment checked Spinal Block Patient position: sitting Prep: ChloraPrep Patient monitoring: cardiac monitor, heart rate, continuous pulse ox and blood pressure Approach: right paramedian Location: L3-4 Injection technique: single-shot Needle Needle type: Pencan  Needle gauge: 24 G Needle length: 9 cm Needle insertion depth: 6 cm

## 2017-09-17 NOTE — Plan of Care (Signed)
Plan of care 

## 2017-09-17 NOTE — Discharge Instructions (Signed)

## 2017-09-17 NOTE — Transfer of Care (Signed)
Immediate Anesthesia Transfer of Care Note  Patient: Sandra Harvey  Procedure(s) Performed: RIGHT TOTAL HIP ARTHROPLASTY ANTERIOR APPROACH (Right Hip)  Patient Location: PACU  Anesthesia Type:Spinal  Level of Consciousness: awake, alert  and oriented  Airway & Oxygen Therapy: Patient Spontanous Breathing and Patient connected to face mask oxygen  Post-op Assessment: Report given to RN  Post vital signs: Reviewed and stable  Last Vitals:  Vitals Value Taken Time  BP 117/52 09/17/2017  9:06 AM  Temp    Pulse 67 09/17/2017  9:11 AM  Resp 16 09/17/2017  9:11 AM  SpO2 100 % 09/17/2017  9:11 AM  Vitals shown include unvalidated device data.  Last Pain:  Vitals:   09/17/17 0545  TempSrc: Oral      Patients Stated Pain Goal: 5 (09/17/17 0558)  Complications: No apparent anesthesia complications

## 2017-09-17 NOTE — Anesthesia Postprocedure Evaluation (Signed)
Anesthesia Post Note  Patient: Nisa Decaire Wesely  Procedure(s) Performed: RIGHT TOTAL HIP ARTHROPLASTY ANTERIOR APPROACH (Right Hip)     Patient location during evaluation: PACU Anesthesia Type: Spinal Level of consciousness: oriented and awake and alert Pain management: pain level controlled Vital Signs Assessment: post-procedure vital signs reviewed and stable Respiratory status: spontaneous breathing, respiratory function stable and patient connected to nasal cannula oxygen Cardiovascular status: blood pressure returned to baseline and stable Postop Assessment: no headache, no backache and no apparent nausea or vomiting Anesthetic complications: no    Last Vitals:  Vitals:   09/17/17 1100 09/17/17 1110  BP: (!) 124/49 (!) 124/49  Pulse: 74 74  Resp: 14 14  Temp: 36.7 C 36.7 C  SpO2: 98% 98%    Last Pain:  Vitals:   09/17/17 1110  TempSrc:   PainSc: 0-No pain                 Harshan Kearley DAVID

## 2017-09-17 NOTE — Op Note (Signed)
NAME:  Sandra Harvey                ACCOUNT NO.: [id number]      MEDICAL RECORD NO.: [id number]      FACILITY:  [facility]      PHYSICIAN:  Shelda Pal  DATE OF BIRTH:  25-Feb-1941     DATE OF PROCEDURE:  09/17/2017                                 OPERATIVE REPORT         PREOPERATIVE DIAGNOSIS: Right  hip osteoarthritis.      POSTOPERATIVE DIAGNOSIS:  Right hip osteoarthritis.      PROCEDURE:  Right total hip replacement through an anterior approach   utilizing DePuy THR system, component size 52mm pinnacle cup, a size 36+4 neutral   Altrex liner, a size 0 Hi Tri Lock stem with a 36+1.5 Articuleze metal head ball.      SURGEON:  Madlyn Frankel. Charlann Boxer, M.D.      ASSISTANT:  Lanney Gins, PA-C     ANESTHESIA:  Spinal.      SPECIMENS:  None.      COMPLICATIONS:  None.      BLOOD LOSS:  350 cc     DRAINS:  None      INDICATION OF THE PROCEDURE:  Sandra Harvey is a 77 y.o. female who had   presented to office for evaluation of right hip pain.  Radiographs revealed   progressive degenerative changes with bone-on-bone   articulation to the  hip joint.  The patient had painful limited range of   motion significantly affecting their overall quality of life.  The patient was failing to    respond to conservative measures, and at this point was ready   to proceed with more definitive measures.  The patient has noted progressive   degenerative changes in his hip, progressive problems and dysfunction   with regarding the hip prior to surgery.  Consent was obtained for   benefit of pain relief.  Specific risk of infection, DVT, component   failure, dislocation, need for revision surgery, as well discussion of   the anterior versus posterior approach were reviewed.  Consent was   obtained for benefit of anterior pain relief through an anterior   approach.      PROCEDURE IN DETAIL:  The patient was brought to operative theater.   Once adequate anesthesia,  preoperative antibiotics, 2 gm of Ancef, 1 gm of Tranexamic Acid, and 10 mg of Decadron administered.   The patient was positioned supine on the OSI Hanna table.  Once adequate   padding of boney process was carried out, we had predraped out the hip, and  used fluoroscopy to confirm orientation of the pelvis and position.      The right hip was then prepped and draped from proximal iliac crest to   mid thigh with shower curtain technique.      Time-out was performed identifying the patient, planned procedure, and   extremity.     An incision was then made 2 cm distal and lateral to the   anterior superior iliac spine extending over the orientation of the   tensor fascia lata muscle and sharp dissection was carried down to the   fascia of the muscle and protractor placed in the soft tissues.      The fascia  was then incised.  The muscle belly was identified and swept   laterally and retractor placed along the superior neck.  Following   cauterization of the circumflex vessels and removing some pericapsular   fat, a second cobra retractor was placed on the inferior neck.  A third   retractor was placed on the anterior acetabulum after elevating the   anterior rectus.  A L-capsulotomy was along the line of the   superior neck to the trochanteric fossa, then extended proximally and   distally.  Tag sutures were placed and the retractors were then placed   intracapsular.  We then identified the trochanteric fossa and   orientation of my neck cut, confirmed this radiographically   and then made a neck osteotomy with the femur on traction.  The femoral   head was removed without difficulty or complication.  Traction was let   off and retractors were placed posterior and anterior around the   acetabulum.      The labrum and foveal tissue were debrided.  I began reaming with a 44mm   reamer and reamed up to 51mm reamer with good bony bed preparation and a 52mm   cup was chosen.  The final  52mm Pinnacle cup was then impacted under fluoroscopy  to confirm the depth of penetration and orientation with respect to   abduction.  A screw was placed followed by the hole eliminator.  The final   36+4 neutral Altrex liner was impacted with good visualized rim fit.  The cup was positioned anatomically within the acetabular portion of the pelvis.      At this point, the femur was rolled at 80 degrees.  Further capsule was   released off the inferior aspect of the femoral neck.  I then   released the superior capsule proximally.  The hook was placed laterally   along the femur and elevated manually and held in position with the bed   hook.  The leg was then extended and adducted with the leg rolled to 100   degrees of external rotation.  Once the proximal femur was fully   exposed, I used a box osteotome to set orientation.  I then began   broaching with the starting chili pepper broach and passed this by hand and then broached up to 0.  With the 0 broach in place I chose a high offset neck and did several trial reductions.  The offset was appropriate, leg lengths   appeared to be equal best matched with the +1.5 head ball confirmed radiographically.   Given these findings, I went ahead and dislocated the hip, repositioned all   retractors and positioned the right hip in the extended and abducted position.  The final 0 Hi Tri Lock stem was   chosen and it was impacted down to the level of neck cut.  Based on this   and the trial reduction, a 36+1.5 Articuleze metal ball was chosen and   impacted onto a clean and dry trunnion, and the hip was reduced.  The   hip had been irrigated throughout the case again at this point.  I did   reapproximate the superior capsular leaflet to the anterior leaflet   using #1 Vicryl.  The fascia of the   tensor fascia lata muscle was then reapproximated using #1 Vicryl and #0 Stratafix sutures.  The   remaining wound was closed with 2-0 Vicryl and running 4-0  Monocryl.   The hip was cleaned, dried,  and dressed sterilely using Dermabond and   Aquacel dressing.  She was then brought   to recovery room in stable condition tolerating the procedure well.    Lanney Gins, PA-C was present for the entirety of the case involved from   preoperative positioning, perioperative retractor management, general   facilitation of the case, as well as primary wound closure as assistant.            Madlyn Frankel Charlann Boxer, M.D.        09/17/2017 8:41 AM

## 2017-09-17 NOTE — Progress Notes (Signed)
Pt has open laceration with reddness noted to left wrist. Per pt "cat scratched her yesterday and she called the office to notifty them of the situaiton."

## 2017-09-18 DIAGNOSIS — E663 Overweight: Secondary | ICD-10-CM | POA: Diagnosis present

## 2017-09-18 LAB — BASIC METABOLIC PANEL
Anion gap: 6 (ref 5–15)
BUN: 15 mg/dL (ref 6–20)
CALCIUM: 8.3 mg/dL — AB (ref 8.9–10.3)
CO2: 25 mmol/L (ref 22–32)
CREATININE: 0.83 mg/dL (ref 0.44–1.00)
Chloride: 108 mmol/L (ref 101–111)
GFR calc non Af Amer: >60 mL/min (ref >60)
Glucose, Bld: 119 mg/dL — ABNORMAL HIGH (ref 65–99)
Potassium: 4.6 mmol/L (ref 3.5–5.1)
SODIUM: 139 mmol/L (ref 135–145)

## 2017-09-18 LAB — CBC
HCT: 29.4 % — ABNORMAL LOW (ref 36.0–46.0)
Hemoglobin: 9.6 g/dL — ABNORMAL LOW (ref 12.0–15.0)
MCH: 30 pg (ref 26.0–34.0)
MCHC: 32.7 g/dL (ref 30.0–36.0)
MCV: 91.9 fL (ref 78.0–100.0)
Platelets: 249 10*3/uL (ref 150–400)
RBC: 3.2 MIL/uL — ABNORMAL LOW (ref 3.87–5.11)
RDW: 14.1 % (ref 11.5–15.5)
WBC: 11.3 10*3/uL — ABNORMAL HIGH (ref 4.0–10.5)

## 2017-09-18 LAB — GLUCOSE, CAPILLARY
GLUCOSE-CAPILLARY: 99 mg/dL (ref 65–99)
GLUCOSE-CAPILLARY: 106 mg/dL — AB (ref 65–99)
GLUCOSE-CAPILLARY: 127 mg/dL — AB (ref 65–99)
GLUCOSE-CAPILLARY: 109 mg/dL — AB (ref 65–99)

## 2017-09-18 NOTE — Progress Notes (Signed)
Spoke with patient and family at bedside. Discussed plan for no PT at d/c. Patient asking about PCS at home, she states she has a long term care policy. Provided her with resource through [employer] website regarding PCS for hire, she plans to review her policy to see if it will cover PCS for her. She states she has all of her DME in place. Planning for d/c in am.

## 2017-09-18 NOTE — Progress Notes (Signed)
Physical Therapy Treatment Patient Details Name: Sandra Harvey MRN: [id number] DOB: Jul 31, 1940 Today's Date: 09/18/2017    History of Present Illness  77 yo female s/p R THA-direct anterior 09/17/17. Pt also has L hand swelling with dressing in place. Pt reported cat scratch prior to admission.     PT Comments    Progressing slowly with mobility. Pt continues to require Min assist for bed mobility, transfers, and gait training. Initiated ROM exercises on today. Noted tightness during hip flexion ROM exercises-pt only able to tolerate ~45 degrees. Will continue to follow and progress activity as tolerated.     Follow Up Recommendations  Follow surgeon's recommendation for DC plan and follow-up therapies (PT continues to recommend HHPT f/u for continued strength, gait and balance training to maximize independence and safety with functional mobility)     Equipment Recommendations  None recommended by PT    Recommendations for Other Services       Precautions / Restrictions Precautions Precautions: Fall Restrictions Weight Bearing Restrictions: No Other Position/Activity Restrictions: WBAT    Mobility  Bed Mobility Overal bed mobility: Needs Assistance Bed Mobility: Supine to Sit     Supine to sit: Min assist;HOB elevated    General bed mobility comments: Assist for R LE. Increased time and effort. Poor use of L hand/UE due to swelling, pain from cat scratch.   Transfers Overall transfer level: Needs assistance Equipment used: Rolling walker (2 wheeled) Transfers: Sit to/from Stand Sit to Stand: Min assist         General transfer comment: Assist to rise, stabilize, control descent. VCs safety, technique, hand/LE placement. Increased time.   Ambulation/Gait Ambulation/Gait assistance: Min assist Ambulation Distance (Feet): 75 Feet Assistive device: Rolling walker (2 wheeled) Gait Pattern/deviations: Step-to pattern;Step-through pattern;Decreased stride length     General Gait Details: Assist to stabilize intermittently. VCs safety, sequence, technique. Slow gait speed. Followed with recliner. Pt denied dizziness.    Stairs             Wheelchair Mobility    Modified Rankin (Stroke Patients Only)       Balance                                            Cognition Arousal/Alertness: Awake/alert Behavior During Therapy: WFL for tasks assessed/performed Overall Cognitive Status: Within Functional Limits for tasks assessed                                        Exercises Total Joint Exercises Ankle Circles/Pumps: AROM;Both;10 reps;Supine Quad Sets: AROM;Both;10 reps;Supine Heel Slides: AAROM;Right;10 reps;Supine Hip ABduction/ADduction: AAROM;Right;10 reps;Supine    General Comments        Pertinent Vitals/Pain Pain Assessment: 0-10 Pain Score: 6  Pain Location: R thigh/groin Pain Descriptors / Indicators: Sore;Tightness;Discomfort Pain Intervention(s): Limited activity within patient's tolerance;Repositioned;Ice applied    Home Living Family/patient expects to be discharged to:: Private residence Living Arrangements: Spouse/significant other Available Help at Discharge: Family         Home Equipment: Dan Humphreys - 2 wheels;Walker - 4 wheels;Cane - single point;Tub bench Additional Comments: daughters are going to take turns to stay with pt    Prior Function Level of Independence: Independent          PT Goals (current goals can now  be found in the care plan section) Acute Rehab PT Goals Patient Stated Goal: less pain. regain independence Progress towards PT goals: Progressing toward goals    Frequency    7X/week      PT Plan Current plan remains appropriate    Co-evaluation              AM-PAC PT "6 Clicks" Daily Activity  Outcome Measure  Difficulty turning over in bed (including adjusting bedclothes, sheets and blankets)?: A Lot Difficulty moving from lying on  back to sitting on the side of the bed? : Unable Difficulty sitting down on and standing up from a chair with arms (e.g., wheelchair, bedside commode, etc,.)?: Unable Help needed moving to and from a bed to chair (including a wheelchair)?: A Little Help needed walking in hospital room?: A Little Help needed climbing 3-5 steps with a railing? : A Lot 6 Click Score: 12    End of Session Equipment Utilized During Treatment: Gait belt Activity Tolerance: Patient tolerated treatment well Patient left: in chair;with call bell/phone within reach;with family/visitor present   PT Visit Diagnosis: Pain;Difficulty in walking, not elsewhere classified (R26.2);Muscle weakness (generalized) (M62.81);Other abnormalities of gait and mobility (R26.89) Pain - Right/Left: Right Pain - part of body: Hip     Time: 1610-9604 PT Time Calculation (min) (ACUTE ONLY): 24 min  Charges:  $Gait Training: 8-22 mins $Therapeutic Exercise: 8-22 mins                    G Codes:          Rebeca Alert, MPT Pager: [phone]

## 2017-09-18 NOTE — Progress Notes (Signed)
CSW consult-SNF  Plan is for Home/ no PT. No SNF needs identified.   CSW signing off.   Vivi Barrack, Theresia Majors, MSW Clinical Social Worker  [phone] 09/18/2017  2:36 PM

## 2017-09-18 NOTE — Progress Notes (Signed)
     Subjective: 1 Day Post-Op Procedure(s) (LRB): RIGHT TOTAL HIP ARTHROPLASTY ANTERIOR APPROACH (Right)   Patient reports pain as mild/moderate.  No events throughout the night, though she didn't sleep well. Plan for discharge tomorrow due to pain control and need for inpatient therapy to meet goal of being discharged home safely with family/caregiver.   Objective:   VITALS:   Vitals:   09/18/17 0053 09/18/17 0528  BP: (!) 94/38 (!) 108/46  Pulse: 75 71  Resp: 16 15  Temp: 98.3 F (36.8 C) 98.6 F (37 C)  SpO2: 99% 97%    Dorsiflexion/Plantar flexion intact Incision: dressing C/D/I No cellulitis present Compartment soft  LABS Recent Labs    09/18/17 0603  HGB 9.6*  HCT 29.4*  WBC 11.3*  PLT 249    Recent Labs    09/18/17 0603  NA 139  K 4.6  BUN 15  CREATININE 0.83  GLUCOSE 119*     Assessment/Plan: 1 Day Post-Op Procedure(s) (LRB): RIGHT TOTAL HIP ARTHROPLASTY ANTERIOR APPROACH (Right) Foley cath d/c'ed Advance diet Up with therapy D/C IV fluids Discharge home eventually, when ready  Overweight (BMI 25-29.9) Estimated body mass index is 26.76 kg/m as calculated from the following:   Height as of this encounter: 5' (1.524 m).   Weight as of this encounter: 62.1 kg (137 lb). Patient also counseled that weight may inhibit the healing process Patient counseled that losing weight will help with future health issues      Anastasio Auerbach. Samora Jernberg   PAC  09/18/2017, 8:15 AM

## 2017-09-18 NOTE — Progress Notes (Addendum)
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 Pt also has L hand swelling with dressing in place. Pt reported cat scratch prior to admission.     PT Comments    Progressing slowly with mobility. Pt c/o moderate pain during session. She also c/o some nausea and intermittent R UE numbness while ambulating. Pt stated sometimes the extremity bothers her. Will continue to follow and progress activity as tolerated.    Follow Up Recommendations  Follow surgeon's recommendation for DC plan and follow-up therapies;Supervision/Assistance - 24 hour (PT continues to recommend at least HHPT f/u)     Equipment Recommendations  None recommended by PT    Recommendations for Other Services       Precautions / Restrictions Precautions Precautions: Fall Restrictions Weight Bearing Restrictions: No Other Position/Activity Restrictions: WBAT    Mobility  Bed Mobility Overal bed mobility: Needs Assistance Bed Mobility: Sit to Supine     Supine to sit: Min assist;HOB elevated Sit to supine: Mod assist   General bed mobility comments: Assist for bil LEs.  Increased time and effort. Poor use of L hand/UE due to swelling, pain from cat scratch.   Transfers Overall transfer level: Needs assistance Equipment used: Rolling walker (2 wheeled) Transfers: Sit to/from Stand Sit to Stand: Min assist         General transfer comment: Increased time. Assist to rise, stabilize, control descent. VCS safety, technique, hand/LE placement.   Ambulation/Gait Ambulation/Gait assistance: Min assist Ambulation Distance (Feet): 60 Feet Assistive device: Rolling walker (2 wheeled) Gait Pattern/deviations: Step-to pattern;Step-through pattern;Decreased stride length     General Gait Details: Assist to stabilize intermittently. VCs safety, sequence,  technique. Slow gait speed. Slow start with difficulty advancing R LE initially.     Stairs             Wheelchair Mobility    Modified Rankin (Stroke Patients Only)       Balance                                            Cognition Arousal/Alertness: Awake/alert Behavior During Therapy: WFL for tasks assessed/performed Overall Cognitive Status: Within Functional Limits for tasks assessed                                        Exercises Total Joint Exercises Ankle Circles/Pumps: AROM;Both;10 reps;Supine Quad Sets: AROM;Both;10 reps;Supine Heel Slides: AAROM;Right;10 reps;Supine Hip ABduction/ADduction: AAROM;Right;10 reps;Supine    General Comments        Pertinent Vitals/Pain Pain Assessment: 0-10 Pain Score: 9  Pain Location: R thigh/groin Pain Descriptors / Indicators: Sore;Tightness;Discomfort Pain Intervention(s): Limited activity within patient's tolerance;Repositioned;Ice applied    Home Living                      Prior Function            PT Goals (current goals can now be found in the care plan section) Progress towards PT goals: Progressing toward goals    Frequency    7X/week      PT Plan Current plan remains appropriate    Co-evaluation  AM-PAC PT "6 Clicks" Daily Activity  Outcome Measure  Difficulty turning over in bed (including adjusting bedclothes, sheets and blankets)?: A Lot Difficulty moving from lying on back to sitting on the side of the bed? : Unable Difficulty sitting down on and standing up from a chair with arms (e.g., wheelchair, bedside commode, etc,.)?: Unable Help needed moving to and from a bed to chair (including a wheelchair)?: A Little Help needed walking in hospital room?: A Little Help needed climbing 3-5 steps with a railing? : A Lot 6 Click Score: 12    End of Session Equipment Utilized During Treatment: Gait belt Activity Tolerance:  Patient limited by pain Patient left: in bed;with call bell/phone within reach;with family/visitor present   PT Visit Diagnosis: Pain;Difficulty in walking, not elsewhere classified (R26.2);Muscle weakness (generalized) (M62.81);Other abnormalities of gait and mobility (R26.89) Pain - Right/Left: Right Pain - part of body: Hip     Time: 1610-9604 PT Time Calculation (min) (ACUTE ONLY): 28 min  Charges:  $Gait Training: 8-22 mins $Therapeutic Exercise: 8-22 mins                    G Codes:          Rebeca Alert, MPT Pager: [phone]

## 2017-09-18 NOTE — Evaluation (Signed)
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 Pt also has L hand swelling with dressing in place. Pt reported cat scratch prior to admission.    Clinical Impression   Pt was admitted for the above.  Pt had low BP during session.  She is moving well but has a lot of pain.  Both of these factors impact ADLs. Daughter and husband present. 2 daughters will assist at home, taking turns. Will follow in acute setting and further assess toilet DME needs and bathroom transfers    Follow Up Recommendations  Supervision/Assistance - 24 hour    Equipment Recommendations  (to be further assessed; has high commode) Pt states she rarely gets up at night   Recommendations for Other Services       Precautions / Restrictions Precautions Precautions: Fall Restrictions Other Position/Activity Restrictions: WBAT      Mobility Bed Mobility         Supine to sit: Min assist;HOB elevated Sit to supine: Mod assist(assist for both legs)   General bed mobility comments: Assist for R LE. Increased time and effort. Poor use of L hand/UE due to swelling, pain from cat scratch.   Transfers   Equipment used: Rolling walker (2 wheeled)   Sit to Stand: Min assist         General transfer comment: extra time to transition to standing    Balance                                           ADL either performed or assessed with clinical judgement   ADL Overall ADL's : Needs assistance/impaired     Grooming: Oral care;Min guard;Standing       Lower Body Bathing: Moderate assistance;Sit to/from stand       Lower Body Dressing: Maximal assistance;Sit to/from stand   Toilet Transfer: Minimal assistance;Ambulation;RW(back to bed)             General ADL Comments: pt is able to perform UB adls with setup.  She  ambulated to bathroom and c/o lightheadedness.  BP on soft side:  see vitals section of chart.  Pt insisted on standing to brush her teeth; therapist tried to talk her into sitting.  BP lower when supine.  RN in room.  ADLs limited by pain and BP     Vision         Perception     Praxis      Pertinent Vitals/Pain Pain Score: 6  Pain Location: R thigh/groin Pain Descriptors / Indicators: Sore;Tightness;Discomfort Pain Intervention(s): Limited activity within patient's tolerance;Monitored during session;Premedicated before session;Repositioned     Hand Dominance     Extremity/Trunk Assessment Upper Extremity Assessment Upper Extremity Assessment: LUE deficits/detail LUE Deficits / Details: L hand swollen and wrapped; was scratched by cat           Communication Communication Communication: No difficulties   Cognition Arousal/Alertness: Awake/alert Behavior During Therapy: WFL for tasks assessed/performed Overall Cognitive Status: Within Functional Limits for tasks assessed                                     General Comments  Exercises     Shoulder Instructions      Home Living Family/patient expects to be discharged to:: Private residence Living Arrangements: Spouse/significant other Available Help at Discharge: Family               Bathroom Shower/Tub: Tub/shower unit   Bathroom Toilet: Handicapped height     Home Equipment: Environmental consultant - 2 wheels;Walker - 4 wheels;Cane - single point;Tub bench   Additional Comments: daughters are going to take turns to stay with pt. Pt has a lot of AE at home, but family will assist with adls as needed      Prior Functioning/Environment Level of Independence: Independent                 OT Problem List: Decreased strength;Decreased activity tolerance;Cardiopulmonary status limiting activity;Decreased knowledge of use of DME or AE;Pain      OT Treatment/Interventions: Self-care/ADL  training;DME and/or AE instruction;Balance training;Patient/family education;Therapeutic activities    OT Goals(Current goals can be found in the care plan section) Acute Rehab OT Goals Patient Stated Goal: less pain. regain independence OT Goal Formulation: With patient Time For Goal Achievement: 10/02/17 Potential to Achieve Goals: Good ADL Goals Pt Will Transfer to Toilet: with min guard assist;ambulating;bedside commode(vs high commode) Pt Will Perform Toileting - Clothing Manipulation and hygiene: with min guard assist;sit to/from stand Pt Will Perform Tub/Shower Transfer: Shower transfer;with min assist;ambulating;tub bench  OT Frequency: Min 2X/week   Barriers to D/C:            Co-evaluation              AM-PAC PT "6 Clicks" Daily Activity     Outcome Measure Help from another person eating meals?: None Help from another person taking care of personal grooming?: A Little Help from another person toileting, which includes using toliet, bedpan, or urinal?: A Little Help from another person bathing (including washing, rinsing, drying)?: A Lot Help from another person to put on and taking off regular upper body clothing?: A Little Help from another person to put on and taking off regular lower body clothing?: A Lot 6 Click Score: 17   End of Session    Activity Tolerance: Other (comment) Patient left: in bed;with call bell/phone within reach;with family/visitor present  OT Visit Diagnosis: Muscle weakness (generalized) (M62.81);Pain Pain - Right/Left: Right Pain - part of body: Hip                Time: 0820-0907 OT Time Calculation (min): 47 min Charges:  OT General Charges $OT Visit: 1 Visit OT Evaluation $OT Eval Low Complexity: 1 Low OT Treatments $Self Care/Home Management : 23-37 mins G-Codes:     [place], OTR/L [phone] 09/18/2017  Daja Shuping 09/18/2017, 9:54 AM

## 2017-09-18 NOTE — Plan of Care (Signed)
Plan of care discussed.   

## 2017-09-19 LAB — CBC
HCT: 29.7 % — ABNORMAL LOW (ref 36.0–46.0)
Hemoglobin: 9.4 g/dL — ABNORMAL LOW (ref 12.0–15.0)
MCH: 29.6 pg (ref 26.0–34.0)
MCHC: 31.6 g/dL (ref 30.0–36.0)
MCV: 93.4 fL (ref 78.0–100.0)
Platelets: 261 10*3/uL (ref 150–400)
RBC: 3.18 MIL/uL — ABNORMAL LOW (ref 3.87–5.11)
RDW: 14.5 % (ref 11.5–15.5)
WBC: 9.5 10*3/uL (ref 4.0–10.5)

## 2017-09-19 LAB — BASIC METABOLIC PANEL
ANION GAP: 7 (ref 5–15)
BUN: 11 mg/dL (ref 6–20)
CO2: 24 mmol/L (ref 22–32)
Calcium: 8.1 mg/dL — ABNORMAL LOW (ref 8.9–10.3)
Chloride: 107 mmol/L (ref 101–111)
Creatinine, Ser: 0.68 mg/dL (ref 0.44–1.00)
GFR calc non Af Amer: >60 mL/min (ref >60)
GLUCOSE: 94 mg/dL (ref 65–99)
Potassium: 4.3 mmol/L (ref 3.5–5.1)
Sodium: 138 mmol/L (ref 135–145)

## 2017-09-19 LAB — GLUCOSE, CAPILLARY: Glucose-Capillary: 72 mg/dL (ref 65–99)

## 2017-09-19 NOTE — Progress Notes (Signed)
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 Pt also has L hand swelling with dressing in place. Pt reported cat scratch prior to admission.     PT Comments    Pt is progressing with mobility. Reviewed exercises, gait training, and stair negotiation training. Issued HEP for pt to perform 2x/day. Pt's husband is at risk for falls. He cannot physically assist pt safely. Instructed pt to only have daughters helping her up/down steps and with standing exercises. Pt stated she wanted to complete PT in one session. All education completed    Follow Up Recommendations  Follow surgeon's recommendation for DC plan and follow-up therapies;Supervision/Assistance - 24 hour (PT continues to recommend HHPT f/u to maximize independence with functional mobility)     Equipment Recommendations  None recommended by PT    Recommendations for Other Services       Precautions / Restrictions Precautions Precautions: Fall Restrictions Weight Bearing Restrictions: No Other Position/Activity Restrictions: WBAT    Mobility  Bed Mobility Overal bed mobility: Needs Assistance Bed Mobility: Supine to Sit     Supine to sit: HOB elevated;Min assist     General bed mobility comments: Assist for R LE and trunk. Increased time and effort. Cues for pt to use L hand more to assist.   Transfers Overall transfer level: Needs assistance Equipment used: Rolling walker (2 wheeled) Transfers: Sit to/from Stand Sit to Stand: Min guard         General transfer comment: Increased time. Close guard for safety. VCs safety, hand/LE placement.   Ambulation/Gait Ambulation/Gait assistance: Min guard Ambulation Distance (Feet): 70 Feet Assistive device: Rolling walker (2 wheeled) Gait Pattern/deviations: Step-to pattern;Step-through pattern;Decreased stride  length     General Gait Details: close guard for safety. slow gait speed.    Stairs Stairs: Yes Stairs assistance: Min guard Stair Management: One rail Left;Step to pattern Number of Stairs: 2 General stair comments: up and over portable steps with use of 2 hands on 1 handrail. VCs safety, technique, sequence. Increased time. close guard for safety. Verbally reviewed with daughter (she arrived at end of session)   Wheelchair Mobility    Modified Rankin (Stroke Patients Only)       Balance                                            Cognition Arousal/Alertness: Awake/alert Behavior During Therapy: WFL for tasks assessed/performed Overall Cognitive Status: Within Functional Limits for tasks assessed                                        Exercises Total Joint Exercises Ankle Circles/Pumps: AROM;Both;10 reps;Supine Quad Sets: AROM;Both;10 reps;Supine Heel Slides: AAROM;Right;10 reps;Supine Hip ABduction/ADduction: AAROM;Right;10 reps;Supine    General Comments        Pertinent Vitals/Pain Pain Assessment: 0-10 Pain Score: 5  Pain Location: R thigh Pain Descriptors / Indicators: Sore;Tightness;Discomfort Pain Intervention(s): Monitored during session;Repositioned    Home Living                      Prior Function            PT Goals (  current goals can now be found in the care plan section) Progress towards PT goals: Progressing toward goals    Frequency    7X/week      PT Plan Current plan remains appropriate    Co-evaluation              AM-PAC PT "6 Clicks" Daily Activity  Outcome Measure  Difficulty turning over in bed (including adjusting bedclothes, sheets and blankets)?: A Lot Difficulty moving from lying on back to sitting on the side of the bed? : Unable Difficulty sitting down on and standing up from a chair with arms (e.g., wheelchair, bedside commode, etc,.)?: Unable Help needed moving  to and from a bed to chair (including a wheelchair)?: A Little Help needed walking in hospital room?: A Little Help needed climbing 3-5 steps with a railing? : A Little 6 Click Score: 13    End of Session Equipment Utilized During Treatment: Gait belt Activity Tolerance: Patient tolerated treatment well Patient left: in chair;with call bell/phone within reach;with family/visitor present   PT Visit Diagnosis: Pain;Difficulty in walking, not elsewhere classified (R26.2);Muscle weakness (generalized) (M62.81);Other abnormalities of gait and mobility (R26.89) Pain - Right/Left: Right Pain - part of body: Hip     Time: 4098-1191 PT Time Calculation (min) (ACUTE ONLY): 48 min  Charges:  $Gait Training: 23-37 mins $Therapeutic Exercise: 8-22 mins                    G Codes:        Rebeca Alert, MPT Pager: [phone]

## 2017-09-19 NOTE — Progress Notes (Signed)
     Subjective: 2 Days Post-Op Procedure(s) (LRB): RIGHT TOTAL HIP ARTHROPLASTY ANTERIOR APPROACH (Right)   Patient reports pain as mild, pain controlled.  No events throughout the night.  Worked well with PT yesterday. We have discussed if needing assistance at home due to other reason that she would most likely need to use a private organiztion or enlist the help from friends.   Ready to be discharged home.   Objective:   VITALS:   Vitals:   09/19/17 0203 09/19/17 0612  BP: (!) 110/54 (!) 95/51  Pulse: 82 93  Resp:    Temp: 98.3 F (36.8 C) 99.1 F (37.3 C)  SpO2: 90% 97%    Dorsiflexion/Plantar flexion intact Incision: dressing C/D/I No cellulitis present Compartment soft  LABS Recent Labs    09/18/17 0603 09/19/17 0544  HGB 9.6* 9.4*  HCT 29.4* 29.7*  WBC 11.3* 9.5  PLT 249 261    Recent Labs    09/18/17 0603 09/19/17 0544  NA 139 138  K 4.6 4.3  BUN 15 11  CREATININE 0.83 0.68  GLUCOSE 119* 94     Assessment/Plan: 2 Days Post-Op Procedure(s) (LRB): RIGHT TOTAL HIP ARTHROPLASTY ANTERIOR APPROACH (Right) Up with therapy Discharge home Follow up in 2 weeks at [facility]. Follow up with OLIN,Gildardo Tickner D in 2 weeks.  Contact information:  [facility] [address], [address] [place] [address] [phone]        Anastasio Auerbach. Georg Ang   PAC  09/19/2017, 8:31 AM

## 2017-09-19 NOTE — Progress Notes (Signed)
Discharge planning, spoke with patient and spouse at bedside. Now with orders for HHPT. Have chosen [facility], evaluate and treat. Contacted [facility] for referral. Needs 3n1, contacted [facility] to deliver to room. [phone]

## 2017-09-19 NOTE — Progress Notes (Signed)
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 Pt also has L hand swelling with dressing in place. Pt reported cat scratch prior to admission.    OT comments  All education completed this session. Pt has used 3:1 in the hospital and has used tub bench at home in the past. Used reacher for [employer] adls  Follow Up Recommendations  Supervision/Assistance - 24 hour    Equipment Recommendations  3 in 1 bedside commode    Recommendations for Other Services      Precautions / Restrictions Precautions Precautions: Fall Restrictions Weight Bearing Restrictions: No Other Position/Activity Restrictions: WBAT       Mobility Bed Mobility Overal bed mobility: Needs Assistance Bed Mobility: Supine to Sit          General bed mobility comments: oob  Transfers Overall transfer level: Needs assistance Equipment used: Rolling walker (2 wheeled) Transfers: Sit to/from Stand Sit to Stand: Min guard         General transfer comment: cues for UE placement    Balance                                           ADL either performed or assessed with clinical judgement   ADL                       Lower Body Dressing: Moderate assistance;Maximal assistance;Sit to/from stand;With adaptive equipment                 General ADL Comments: pt has a Sports administrator at home.  Used for underwear and pants.  Assisted with shoes with long shoehorn. Pt's husband was on toilet:  she verbalizes that she would like a 3:1 for home for use at night. Also explained that this could be put over her commode during the day (by someone else) so that she will have the 2 armrests to push up from. Pt verbalizes use of tub bench at home     Vision       Perception     Praxis      Cognition Arousal/Alertness:  Awake/alert Behavior During Therapy: WFL for tasks assessed/performed Overall Cognitive Status: Within Functional Limits for tasks assessed                                          Exercises    Shoulder Instructions       General Comments      Pertinent Vitals/ Pain       Pain Assessment: 0-10 Pain Score: 5  Pain Location: R thigh Pain Descriptors / Indicators: Sore;Tightness;Discomfort Pain Intervention(s): Limited activity within patient's tolerance;Monitored during session;Premedicated before session;Repositioned  Home Living                                          Prior Functioning/Environment              Frequency           Progress Toward Goals  OT Goals(current goals can now be  found in the care plan section)  Progress towards OT goals: Progressing toward goals     Plan      Co-evaluation                 AM-PAC PT "6 Clicks" Daily Activity     Outcome Measure   Help from another person eating meals?: None Help from another person taking care of personal grooming?: A Little Help from another person toileting, which includes using toliet, bedpan, or urinal?: A Little Help from another person bathing (including washing, rinsing, drying)?: A Little Help from another person to put on and taking off regular upper body clothing?: A Little Help from another person to put on and taking off regular lower body clothing?: A Lot 6 Click Score: 18    End of Session    OT Visit Diagnosis: Muscle weakness (generalized) (M62.81);Pain Pain - Right/Left: Right Pain - part of body: Hip   Activity Tolerance Patient tolerated treatment well   Patient Left in chair;with call bell/phone within reach;with family/visitor present   Nurse Communication          Time: 1610-9604 OT Time Calculation (min): 18 min  Charges: OT General Charges $OT Visit: 1 Visit OT Treatments $Self Care/Home Management : 8-22  mins  Marica Otter, OTR/L [phone] 09/19/2017   Sandra Harvey 09/19/2017, 10:39 AM

## 2017-09-24 NOTE — Discharge Summary (Signed)
Physician Discharge Summary  Patient ID: Sandra Harvey MRN: [id number] DOB/AGE: 78-Jul-1942 78 y.o.  Admit date: 09/17/2017 Discharge date: 09/19/2017   Procedures:  Procedure(s) (LRB): RIGHT TOTAL HIP ARTHROPLASTY ANTERIOR APPROACH (Right)  Attending Physician:  Dr. Durene Romans   Admission Diagnoses:   Right hip primary OA / pain  Discharge Diagnoses:  Principal Problem:   S/P right THA, AA Active Problems:   Overweight (BMI 25.0-29.9)  Past Medical History:  Diagnosis Date  . Allergy   . Anemia   . Anxiety   . Arthritis   . Cataract   . Complication of anesthesia    slow to wake up   . Depression   . Diabetes mellitus    type II   . GERD (gastroesophageal reflux disease)   . Headache    hx of migraines   . History of migraine headaches   . History of osteopenia   . Hypercholesterolemia   . Hyperlipidemia   . Hypertension   . Hypothyroidism   . Plantar warts   . Stomach ulcer    diagnosed 08/2017     HPI:    Sandra Harvey, 77 y.o. female, has a history of pain and functional disability in the right hip(s) due to arthritis and patient has failed non-surgical conservative treatments for greater than 12 weeks to include NSAID's and/or analgesics, corticosteriod injections, use of assistive devices and activity modification.  Onset of symptoms was gradual starting 2+ years ago with gradually worsening course since that time.The patient noted no past surgery on the right hip(s).  Patient currently rates pain in the right hip at 10 out of 10 with activity. Patient has night pain, worsening of pain with activity and weight bearing, trendelenberg gait, pain that interfers with activities of daily living and pain with passive range of motion. Patient has evidence of periarticular osteophytes and joint space narrowing by imaging studies. This condition presents safety issues increasing the risk of falls. There is no current active infection.  Risks, benefits and expectations  were discussed with the patient.  Risks including but not limited to the risk of anesthesia, blood clots, nerve damage, blood vessel damage, failure of the prosthesis, infection and up to and including death.  Patient understand the risks, benefits and expectations and wishes to proceed with surgery.   PCP: Merri Brunette, MD   Discharged Condition: good  Hospital Course:  Patient underwent the above stated procedure on 09/17/2017. Patient tolerated the procedure well and brought to the recovery room in good condition and subsequently to the floor.  POD #1 BP: 108/46 ; Pulse: 71 ; Temp: 98.6 F (37 C) ; Resp: 15 Patient reports pain as mild/moderate.  No events throughout the night, though she didn't sleep well. Plan for discharge tomorrowdue to pain control and need for inpatient therapy to meet goal of being discharged home safely with family/caregiver. Dorsiflexion/plantar flexion intact, incision: dressing C/D/I, no cellulitis present and compartment soft.   LABS  Basename    HGB     9.6  HCT     29.4   POD #2  BP: 95/51 ; Pulse: 93 ; Temp: 99.1 F (37.3 C)  Patient reports pain as mild, pain controlled.  No events throughout the night.  Worked well with PT yesterday. We have discussed if needing assistance at home due to other reason that she would most likely need to use a private organiztion or enlist the help from friends.   Ready to be discharged home.  Dorsiflexion/plantar flexion intact, incision: dressing C/D/I, no cellulitis present and compartment soft.   LABS  Basename    HGB     9.4  HCT     29.7    Discharge Exam: General appearance: alert, cooperative and no distress Extremities: Homans sign is negative, no sign of DVT, no edema, redness or tenderness in the calves or thighs and no ulcers, gangrene or trophic changes  Disposition:  Home with follow up in 2 weeks   Follow-up Information    Durene Romans, MD. Schedule an appointment as soon as possible for a visit  in 2 weeks.   Specialty:  Orthopedic Surgery Contact information: [address] [place] [address] [phone]        [facility] Follow up.   Specialty:  Home Health Services Why:  physical therapy Contact information: [address] [place] [address] [phone]        [facility] - Dme Follow up.   Why:  3n[facility] Contact information: [address] [place] [address] [phone]           Discharge Instructions    Call MD / Call 911   Complete by:  As directed    If you experience chest pain or shortness of breath, CALL 911 and be transported to the hospital emergency room.  If you develope a fever above 101 F, pus (white drainage) or increased drainage or redness at the wound, or calf pain, call your surgeon's office.   Change dressing   Complete by:  As directed    Maintain surgical dressing until follow up in the clinic. If the edges start to pull up, may reinforce with tape. If the dressing is no longer working, may remove and cover with gauze and tape, but must keep the area dry and clean.  Call with any questions or concerns.   Constipation Prevention   Complete by:  As directed    Drink plenty of fluids.  Prune juice may be helpful.  You may use a stool softener, such as Colace (over the counter) 100 mg twice a day.  Use MiraLax (over the counter) for constipation as needed.   Diet - low sodium heart healthy   Complete by:  As directed    Discharge instructions   Complete by:  As directed    Maintain surgical dressing until follow up in the clinic. If the edges start to pull up, may reinforce with tape. If the dressing is no longer working, may remove and cover with gauze and tape, but must keep the area dry and clean.  Follow up in 2 weeks at [facility]. Call with any questions or concerns.   Increase activity slowly as tolerated   Complete by:  As directed    Weight bearing as tolerated with  assist device (walker, cane, etc) as directed, use it as long as suggested by your surgeon or therapist, typically at least 4-6 weeks.   TED hose   Complete by:  As directed    Use stockings (TED hose) for 2 weeks on both leg(s).  You may remove them at night for sleeping.      Allergies as of 09/19/2017      Reactions   Ibuprofen Nausea Only   Meloxicam    Caused ulcer    Nsaids Nausea And Vomiting   With prolonged use      Medication List    STOP taking these medications  ALKA-SELTZER PLS SINUS & COUGH PO   aspirin EC 81 MG tablet Replaced by:  aspirin 81 MG chewable tablet   HYDROcodone-acetaminophen 5-325 MG tablet Commonly known as:  NORCO/VICODIN Replaced by:  HYDROcodone-acetaminophen 7.5-325 MG tablet     TAKE these medications   amLODipine 2.5 MG tablet Commonly known as:  NORVASC Take 2.5 mg by mouth daily after breakfast.   aspirin 81 MG chewable tablet Commonly known as:  ASPIRIN CHILDRENS Chew 1 tablet (81 mg total) by mouth 2 (two) times daily. Take for 4 weeks, then resume regular dose. Replaces:  aspirin EC 81 MG tablet   baclofen 10 MG tablet Commonly known as:  LIORESAL Take 10 mg by mouth 3 (three) times daily as needed (for severe migraine headaches.).   cetirizine 10 MG tablet Commonly known as:  ZYRTEC Take 10 mg by mouth daily as needed for allergies.   cholecalciferol 1000 units tablet Commonly known as:  VITAMIN D Take 1,000 Units by mouth 2 (two) times daily.   cyclobenzaprine 5 MG tablet Commonly known as:  FLEXERIL Take 1 tablet (5 mg total) by mouth 3 (three) times daily as needed. What changed:  reasons to take this   DEXILANT 60 MG capsule Generic drug:  dexlansoprazole Take 60 mg by mouth daily before breakfast. 30 minutes prior to breakfast   docusate sodium 100 MG capsule Commonly known as:  COLACE Take 1 capsule (100 mg total) by mouth 2 (two) times daily.   ferrous sulfate 325 (65 FE) MG tablet Commonly known as:   FERROUSUL Take 1 tablet (325 mg total) by mouth 3 (three) times daily with meals.   hydrochlorothiazide 25 MG tablet Commonly known as:  HYDRODIURIL Take 12.5 mg by mouth daily after breakfast.   HYDROcodone-acetaminophen 7.5-325 MG tablet Commonly known as:  NORCO Take 1-2 tablets by mouth every 4 (four) hours as needed for moderate pain. Replaces:  HYDROcodone-acetaminophen 5-325 MG tablet   imipramine 50 MG tablet Commonly known as:  TOFRANIL Take 50 mg by mouth at bedtime.   metFORMIN 500 MG 24 hr tablet Commonly known as:  GLUCOPHAGE-XR Take 500 mg by mouth 2 (two) times daily after a meal.   pantoprazole 40 MG tablet Commonly known as:  PROTONIX Take 40 mg by mouth daily before breakfast.   polyethylene glycol packet Commonly known as:  MIRALAX / GLYCOLAX Take 17 g by mouth 2 (two) times daily.   rosuvastatin 20 MG tablet Commonly known as:  CRESTOR Take 20 mg by mouth daily after supper.   sertraline 100 MG tablet Commonly known as:  ZOLOFT Take 100 mg by mouth daily after breakfast.   SYNTHROID 88 MCG tablet Generic drug:  levothyroxine Take 88 mcg by mouth daily before breakfast. 1 hour prior to breakfast   telmisartan 80 MG tablet Commonly known as:  MICARDIS Take 80 mg by mouth daily after breakfast.   Vitamin B-12 2500 MCG Subl Place 2,500 mcg under the tongue daily.            Discharge Care Instructions  (From admission, onward)        Start     Ordered   09/18/17 0000  Change dressing    Comments:  Maintain surgical dressing until follow up in the clinic. If the edges start to pull up, may reinforce with tape. If the dressing is no longer working, may remove and cover with gauze and tape, but must keep the area dry and clean.  Call with any questions or  concerns.   09/18/17 0831       Signed: Anastasio Auerbach. Alayasia Breeding   PA-C  09/24/2017, 1:33 PM

## 2017-10-28 DIAGNOSIS — M25561 Pain in right knee: Secondary | ICD-10-CM | POA: Insufficient documentation

## 2017-11-25 ENCOUNTER — Encounter: Payer: Self-pay | Admitting: Podiatry

## 2017-11-25 ENCOUNTER — Ambulatory Visit (INDEPENDENT_AMBULATORY_CARE_PROVIDER_SITE_OTHER): Payer: Medicare Other | Admitting: Podiatry

## 2017-11-25 VITALS — BP 98/77 | HR 77 | Temp 98.7°F | Resp 16

## 2017-11-25 DIAGNOSIS — B351 Tinea unguium: Secondary | ICD-10-CM

## 2017-11-25 DIAGNOSIS — E1151 Type 2 diabetes mellitus with diabetic peripheral angiopathy without gangrene: Secondary | ICD-10-CM | POA: Diagnosis not present

## 2017-12-10 NOTE — Progress Notes (Signed)
  Subjective:  Patient ID: Sandra Harvey, female    DOB: 09/15/1940,  MRN: [id number]  Chief Complaint  Patient presents with  . debride    B/L nail care   77 y.o. female returns for diabetic foot care.  No new complaints.  Objective:   General AA&O x3. Normal mood and affect.  Vascular Dorsalis pedis pulses present 1+ bilaterally  Posterior tibial pulses absent bilaterally  Capillary refill normal to all digits. Pedal hair growth normal.  Neurologic Epicritic sensation present bilaterally. Protective sensation with 5.07 monofilament  present bilaterally. Vibratory sensation present bilaterally.  Dermatologic No open lesions. Interspaces clear of maceration.  Normal skin temperature and turgor. Hyperkeratotic lesions: None bilaterally. Nails: brittle, onychomycosis, thickening, elongation  Orthopedic: No history of amputation. MMT 5/5 in dorsiflexion, plantarflexion, inversion, and eversion. Normal lower extremity joint ROM without pain or crepitus.   Assessment & Plan:  Patient was evaluated and treated and all questions answered.  Diabetes with PAD, Onychomycosis -Educated on diabetic footcare. Diabetic risk level 1 -Nails x10 debrided sharply and manually with large nail nipper and rotary burr.   Procedure: Nail Debridement Rationale: Patient meets criteria for routine foot care due to Class B findings. Type of Debridement: manual, sharp debridement. Instrumentation: Nail nipper, rotary burr. Number of Nails: 10     No follow-ups on file.

## 2018-02-13 ENCOUNTER — Other Ambulatory Visit: Payer: Self-pay | Admitting: Obstetrics and Gynecology

## 2018-02-13 DIAGNOSIS — Z1231 Encounter for screening mammogram for malignant neoplasm of breast: Secondary | ICD-10-CM

## 2018-02-24 ENCOUNTER — Ambulatory Visit (INDEPENDENT_AMBULATORY_CARE_PROVIDER_SITE_OTHER): Payer: Medicare Other | Admitting: Podiatry

## 2018-02-24 ENCOUNTER — Encounter: Payer: Self-pay | Admitting: Podiatry

## 2018-02-24 VITALS — BP 116/60 | HR 77 | Resp 16

## 2018-02-24 DIAGNOSIS — E1151 Type 2 diabetes mellitus with diabetic peripheral angiopathy without gangrene: Secondary | ICD-10-CM

## 2018-02-24 DIAGNOSIS — B351 Tinea unguium: Secondary | ICD-10-CM

## 2018-02-24 NOTE — Progress Notes (Signed)
Subjective:  Patient ID: Sandra Harvey, female    DOB: Nov 04, 1940,  MRN: [id number]  Chief Complaint  Patient presents with  . debride    BL nail care    77 y.o. female presents  for diabetic foot care. Last AMBS was unknown, patient is injected.  Last A1c 6.7.  Last saw PCP Sandra Harvey in July. Denies numbness and tingling in their feet. Denies cramping in legs and thighs.  Review of Systems: Negative except as noted in the HPI. Denies N/V/F/Ch.  Past Medical History:  Diagnosis Date  . Allergy   . Anemia   . Anxiety   . Arthritis   . Cataract   . Complication of anesthesia    slow to wake up   . Depression   . Diabetes mellitus    type II   . GERD (gastroesophageal reflux disease)   . Headache    hx of migraines   . History of migraine headaches   . History of osteopenia   . Hypercholesterolemia   . Hyperlipidemia   . Hypertension   . Hypothyroidism   . Plantar warts   . Stomach ulcer    diagnosed 08/2017     Current Outpatient Medications:  .  amLODipine (NORVASC) 2.5 MG tablet, Take 2.5 mg by mouth daily after breakfast., Disp: , Rfl: 3 .  baclofen (LIORESAL) 10 MG tablet, Take 10 mg by mouth 3 (three) times daily as needed (for severe migraine headaches.)., Disp: , Rfl:  .  cetirizine (ZYRTEC) 10 MG tablet, Take 10 mg by mouth daily as needed for allergies., Disp: , Rfl:  .  cholecalciferol (VITAMIN D) 1000 UNITS tablet, Take 1,000 Units by mouth 2 (two) times daily., Disp: , Rfl:  .  Cyanocobalamin (VITAMIN B-12) 2500 MCG SUBL, Place 2,500 mcg under the tongue daily., Disp: , Rfl:  .  cyclobenzaprine (FLEXERIL) 5 MG tablet, Take 1 tablet (5 mg total) by mouth 3 (three) times daily as needed., Disp: 30 tablet, Rfl: 0 .  dexlansoprazole (DEXILANT) 60 MG capsule, Take 60 mg by mouth daily before breakfast. 30 minutes prior to breakfast, Disp: , Rfl:  .  docusate sodium (COLACE) 100 MG capsule, Take 1 capsule (100 mg total) by mouth 2 (two) times daily., Disp: 10  capsule, Rfl: 0 .  ferrous sulfate (FERROUSUL) 325 (65 FE) MG tablet, Take 1 tablet (325 mg total) by mouth 3 (three) times daily with meals., Disp: , Rfl: 3 .  hydrochlorothiazide (HYDRODIURIL) 25 MG tablet, Take 12.5 mg by mouth daily after breakfast., Disp: , Rfl:  .  HYDROcodone-acetaminophen (NORCO) 7.5-325 MG tablet, Take 1-2 tablets by mouth every 4 (four) hours as needed for moderate pain., Disp: 60 tablet, Rfl: 0 .  imipramine (TOFRANIL) 50 MG tablet, Take 50 mg by mouth at bedtime., Disp: , Rfl:  .  metFORMIN (GLUCOPHAGE-XR) 500 MG 24 hr tablet, Take 500 mg by mouth 2 (two) times daily after a meal., Disp: , Rfl:  .  pantoprazole (PROTONIX) 40 MG tablet, Take 40 mg by mouth daily before breakfast. , Disp: , Rfl:  .  polyethylene glycol (MIRALAX / GLYCOLAX) packet, Take 17 g by mouth 2 (two) times daily., Disp: 14 each, Rfl: 0 .  rosuvastatin (CRESTOR) 20 MG tablet, Take 20 mg by mouth daily after supper., Disp: , Rfl:  .  sertraline (ZOLOFT) 100 MG tablet, Take 100 mg by mouth daily after breakfast., Disp: , Rfl:  .  SYNTHROID 88 MCG tablet, Take 88 mcg by mouth  daily before breakfast. 1 hour prior to breakfast, Disp: , Rfl:  .  telmisartan (MICARDIS) 80 MG tablet, Take 80 mg by mouth daily after breakfast., Disp: , Rfl:   Social History   Tobacco Use  Smoking Status Former Smoker  . Packs/day: 1.00  . Years: 30.00  . Pack years: 30.00  . Types: Cigarettes  . Last attempt to quit: 11/07/1992  . Years since quitting: 25.3  Smokeless Tobacco Never Used    Allergies  Allergen Reactions  . Ibuprofen Nausea Only  . Meloxicam     Caused ulcer   . Nsaids Nausea And Vomiting    With prolonged use   Objective:   Vitals:   02/24/18 1422  BP: 116/60  Pulse: 77  Resp: 16   There is no height or weight on file to calculate BMI. Constitutional Well developed. Well nourished.  Vascular Dorsalis pedis pulses present 1+ bilaterally  Posterior tibial pulses absent bilaterally    Pedal hair growth absent. Capillary refill normal to all digits.  No cyanosis or clubbing noted.  Neurologic Normal speech. Oriented to person, place, and time. Epicritic sensation to light touch grossly present bilaterally. Protective sensation with 5.07 monofilament  present bilaterally. Vibratory sensation present bilaterally.  Dermatologic Nails elongated, thickened, dystrophic. No open wounds. No skin lesions.  Orthopedic: Normal joint ROM without pain or crepitus bilaterally. No visible deformities. No bony tenderness.   Assessment:   1. Diabetes mellitus type 2 with peripheral artery disease (HCC)   2. Onychomycosis    Plan:  Patient was evaluated and treated and all questions answered.  Diabetes with PAD, Onychomycosis -Educated on diabetic footcare. Diabetic risk level 1 -Nails x10 debrided sharply and manually with large nail nipper and rotary burr.   Procedure: Nail Debridement Rationale: Patient meets criteria for routine foot care due to PAD Type of Debridement: manual, sharp debridement. Instrumentation: Nail nipper, rotary burr. Number of Nails: 10   Return in about 3 months (around 05/27/2018) for Diabetic Foot Care.

## 2018-03-12 ENCOUNTER — Ambulatory Visit: Payer: Medicare Other

## 2018-04-15 ENCOUNTER — Ambulatory Visit
Admission: RE | Admit: 2018-04-15 | Discharge: 2018-04-15 | Disposition: A | Discharge status: Home / Self Care | Payer: Medicare Other | Source: Ambulatory Visit | Attending: Obstetrics and Gynecology | Admitting: Obstetrics and Gynecology

## 2018-04-15 DIAGNOSIS — Z1231 Encounter for screening mammogram for malignant neoplasm of breast: Secondary | ICD-10-CM

## 2018-05-26 ENCOUNTER — Ambulatory Visit (INDEPENDENT_AMBULATORY_CARE_PROVIDER_SITE_OTHER): Payer: Medicare Other | Admitting: Podiatry

## 2018-05-26 DIAGNOSIS — B351 Tinea unguium: Secondary | ICD-10-CM

## 2018-05-26 DIAGNOSIS — E1151 Type 2 diabetes mellitus with diabetic peripheral angiopathy without gangrene: Secondary | ICD-10-CM

## 2018-06-08 NOTE — Progress Notes (Signed)
Subjective:  Patient ID: Sandra Harvey, female    DOB: 1940-07-19,  MRN: [id number]  Chief Complaint  Patient presents with  . debride    BL nail trimming    78 y.o. female presents  for diabetic foot care. Last AMBS was unknown.  Last A1c 6.7.  Last saw PCP Dr. Renne CriglerPharr in July. Denies numbness and tingling in their feet. Denies cramping in legs and thighs.  Review of Systems: Negative except as noted in the HPI. Denies N/V/F/Ch.  Past Medical History:  Diagnosis Date  . Allergy   . Anemia   . Anxiety   . Arthritis   . Cataract   . Complication of anesthesia    slow to wake up   . Depression   . Diabetes mellitus    type II   . GERD (gastroesophageal reflux disease)   . Headache    hx of migraines   . History of migraine headaches   . History of osteopenia   . Hypercholesterolemia   . Hyperlipidemia   . Hypertension   . Hypothyroidism   . Plantar warts   . Stomach ulcer    diagnosed 08/2017     Current Outpatient Medications:  .  amLODipine (NORVASC) 2.5 MG tablet, Take 2.5 mg by mouth daily after breakfast., Disp: , Rfl: 3 .  baclofen (LIORESAL) 10 MG tablet, Take 10 mg by mouth 3 (three) times daily as needed (for severe migraine headaches.)., Disp: , Rfl:  .  cetirizine (ZYRTEC) 10 MG tablet, Take 10 mg by mouth daily as needed for allergies., Disp: , Rfl:  .  cholecalciferol (VITAMIN D) 1000 UNITS tablet, Take 1,000 Units by mouth 2 (two) times daily., Disp: , Rfl:  .  Cyanocobalamin (VITAMIN B-12) 2500 MCG SUBL, Place 2,500 mcg under the tongue daily., Disp: , Rfl:  .  cyclobenzaprine (FLEXERIL) 5 MG tablet, Take 1 tablet (5 mg total) by mouth 3 (three) times daily as needed., Disp: 30 tablet, Rfl: 0 .  dexlansoprazole (DEXILANT) 60 MG capsule, Take 60 mg by mouth daily before breakfast. 30 minutes prior to breakfast, Disp: , Rfl:  .  docusate sodium (COLACE) 100 MG capsule, Take 1 capsule (100 mg total) by mouth 2 (two) times daily., Disp: 10 capsule, Rfl: 0 .   ferrous sulfate (FERROUSUL) 325 (65 FE) MG tablet, Take 1 tablet (325 mg total) by mouth 3 (three) times daily with meals., Disp: , Rfl: 3 .  hydrochlorothiazide (HYDRODIURIL) 25 MG tablet, Take 12.5 mg by mouth daily after breakfast., Disp: , Rfl:  .  HYDROcodone-acetaminophen (NORCO) 7.5-325 MG tablet, Take 1-2 tablets by mouth every 4 (four) hours as needed for moderate pain., Disp: 60 tablet, Rfl: 0 .  imipramine (TOFRANIL) 50 MG tablet, Take 50 mg by mouth at bedtime., Disp: , Rfl:  .  metFORMIN (GLUCOPHAGE-XR) 500 MG 24 hr tablet, Take 500 mg by mouth 2 (two) times daily after a meal., Disp: , Rfl:  .  pantoprazole (PROTONIX) 40 MG tablet, Take 40 mg by mouth daily before breakfast. , Disp: , Rfl:  .  polyethylene glycol (MIRALAX / GLYCOLAX) packet, Take 17 g by mouth 2 (two) times daily., Disp: 14 each, Rfl: 0 .  rosuvastatin (CRESTOR) 20 MG tablet, Take 20 mg by mouth daily after supper., Disp: , Rfl:  .  sertraline (ZOLOFT) 100 MG tablet, Take 100 mg by mouth daily after breakfast., Disp: , Rfl:  .  SYNTHROID 88 MCG tablet, Take 88 mcg by mouth daily before breakfast.  1 hour prior to breakfast, Disp: , Rfl:  .  telmisartan (MICARDIS) 80 MG tablet, Take 80 mg by mouth daily after breakfast., Disp: , Rfl:   Social History   Tobacco Use  Smoking Status Former Smoker  . Packs/day: 1.00  . Years: 30.00  . Pack years: 30.00  . Types: Cigarettes  . Last attempt to quit: 11/07/1992  . Years since quitting: 25.6  Smokeless Tobacco Never Used    Allergies  Allergen Reactions  . Ibuprofen Nausea Only  . Meloxicam     Caused ulcer   . Nsaids Nausea And Vomiting    With prolonged use   Objective:   There were no vitals filed for this visit. There is no height or weight on file to calculate BMI. Constitutional Well developed. Well nourished.  Vascular Dorsalis pedis pulses present 1+ bilaterally  Posterior tibial pulses absent bilaterally  Pedal hair growth absent. Capillary  refill normal to all digits.  No cyanosis or clubbing noted.  Neurologic Normal speech. Oriented to person, place, and time. Epicritic sensation to light touch grossly present bilaterally. Protective sensation with 5.07 monofilament  present bilaterally. Vibratory sensation present bilaterally.  Dermatologic Nails elongated, thickened, dystrophic. No open wounds. No skin lesions.  Orthopedic: Normal joint ROM without pain or crepitus bilaterally. No visible deformities. No bony tenderness.   Assessment:   1. Onychomycosis   2. Diabetes mellitus type 2 with peripheral artery disease (HCC)    Plan:  Patient was evaluated and treated and all questions answered.  Diabetes with PAD, Onychomycosis -Educated on diabetic footcare. Diabetic risk level 1 -Nails x10 debrided sharply and manually with large nail nipper and rotary burr.   Procedure: Nail Debridement Rationale: Patient meets criteria for routine foot care due to PAD Type of Debridement: manual, sharp debridement. Instrumentation: Nail nipper, rotary burr. Number of Nails: 10   No follow-ups on file.

## 2018-06-10 ENCOUNTER — Ambulatory Visit: Payer: Medicare Other

## 2018-08-25 ENCOUNTER — Ambulatory Visit: Payer: Medicare Other | Admitting: Podiatry

## 2018-09-29 ENCOUNTER — Ambulatory Visit: Payer: Medicare Other | Admitting: Podiatry

## 2018-11-03 ENCOUNTER — Encounter: Payer: Self-pay | Admitting: Podiatry

## 2018-11-03 ENCOUNTER — Other Ambulatory Visit: Payer: Self-pay

## 2018-11-03 ENCOUNTER — Ambulatory Visit (INDEPENDENT_AMBULATORY_CARE_PROVIDER_SITE_OTHER): Payer: Medicare Other | Admitting: Podiatry

## 2018-11-03 VITALS — Temp 97.0°F

## 2018-11-03 DIAGNOSIS — E1151 Type 2 diabetes mellitus with diabetic peripheral angiopathy without gangrene: Secondary | ICD-10-CM

## 2018-11-03 DIAGNOSIS — E1169 Type 2 diabetes mellitus with other specified complication: Secondary | ICD-10-CM | POA: Diagnosis not present

## 2018-11-03 DIAGNOSIS — B351 Tinea unguium: Secondary | ICD-10-CM

## 2018-11-04 NOTE — Progress Notes (Signed)
Subjective:  Patient ID: Sandra Harvey, female    DOB: 04/14/1941,  MRN: [id number]  Chief Complaint  Patient presents with  . Diabetes    diabetic nail care   pcp Pharr last seen last week a1c 5.9 been feeling run down checking thyroid levels will soon be adjusting meds     78 y.o. female presents  for diabetic foot care. Last AMBS was unknown.  Last A1c 5.9.  Last saw PCP Dr. Shelia Media last week. Denies numbness and tingling in their feet. Denies cramping in legs and thighs.  Review of Systems: Negative except as noted in the HPI. Denies N/V/F/Ch.  Past Medical History:  Diagnosis Date  . Allergy   . Anemia   . Anxiety   . Arthritis   . Cataract   . Complication of anesthesia    slow to wake up   . Depression   . Diabetes mellitus    type II   . GERD (gastroesophageal reflux disease)   . Headache    hx of migraines   . History of migraine headaches   . History of osteopenia   . Hypercholesterolemia   . Hyperlipidemia   . Hypertension   . Hypothyroidism   . Plantar warts   . Stomach ulcer    diagnosed 08/2017     Current Outpatient Medications:  .  amLODipine (NORVASC) 2.5 MG tablet, Take 2.5 mg by mouth daily after breakfast., Disp: , Rfl: 3 .  baclofen (LIORESAL) 10 MG tablet, Take 10 mg by mouth 3 (three) times daily as needed (for severe migraine headaches.)., Disp: , Rfl:  .  cetirizine (ZYRTEC) 10 MG tablet, Take 10 mg by mouth daily as needed for allergies., Disp: , Rfl:  .  cholecalciferol (VITAMIN D) 1000 UNITS tablet, Take 1,000 Units by mouth 2 (two) times daily., Disp: , Rfl:  .  Cyanocobalamin (VITAMIN B-12) 2500 MCG SUBL, Place 2,500 mcg under the tongue daily., Disp: , Rfl:  .  cyclobenzaprine (FLEXERIL) 5 MG tablet, Take 1 tablet (5 mg total) by mouth 3 (three) times daily as needed., Disp: 30 tablet, Rfl: 0 .  dexlansoprazole (DEXILANT) 60 MG capsule, Take 60 mg by mouth daily before breakfast. 30 minutes prior to breakfast, Disp: , Rfl:  .  docusate  sodium (COLACE) 100 MG capsule, Take 1 capsule (100 mg total) by mouth 2 (two) times daily., Disp: 10 capsule, Rfl: 0 .  ferrous sulfate (FERROUSUL) 325 (65 FE) MG tablet, Take 1 tablet (325 mg total) by mouth 3 (three) times daily with meals., Disp: , Rfl: 3 .  hydrochlorothiazide (HYDRODIURIL) 25 MG tablet, Take 12.5 mg by mouth daily after breakfast., Disp: , Rfl:  .  HYDROcodone-acetaminophen (NORCO) 7.5-325 MG tablet, Take 1-2 tablets by mouth every 4 (four) hours as needed for moderate pain., Disp: 60 tablet, Rfl: 0 .  imipramine (TOFRANIL) 50 MG tablet, Take 50 mg by mouth at bedtime., Disp: , Rfl:  .  metFORMIN (GLUCOPHAGE-XR) 500 MG 24 hr tablet, Take 500 mg by mouth 2 (two) times daily after a meal., Disp: , Rfl:  .  pantoprazole (PROTONIX) 40 MG tablet, Take 40 mg by mouth daily before breakfast. , Disp: , Rfl:  .  polyethylene glycol (MIRALAX / GLYCOLAX) packet, Take 17 g by mouth 2 (two) times daily., Disp: 14 each, Rfl: 0 .  rosuvastatin (CRESTOR) 20 MG tablet, Take 20 mg by mouth daily after supper., Disp: , Rfl:  .  sertraline (ZOLOFT) 100 MG tablet, Take 100 mg  by mouth daily after breakfast., Disp: , Rfl:  .  SYNTHROID 88 MCG tablet, Take 88 mcg by mouth daily before breakfast. 1 hour prior to breakfast, Disp: , Rfl:  .  telmisartan (MICARDIS) 80 MG tablet, Take 80 mg by mouth daily after breakfast., Disp: , Rfl:   Social History   Tobacco Use  Smoking Status Former Smoker  . Packs/day: 1.00  . Years: 30.00  . Pack years: 30.00  . Types: Cigarettes  . Quit date: 11/07/1992  . Years since quitting: 26.0  Smokeless Tobacco Never Used    Allergies  Allergen Reactions  . Ibuprofen Nausea Only  . Meloxicam     Caused ulcer   . Nsaids Nausea And Vomiting    With prolonged use   Objective:   Vitals:   11/03/18 1606  Temp: (!) 97 F (36.1 C)   There is no height or weight on file to calculate BMI. Constitutional Well developed. Well nourished.  Vascular Dorsalis  pedis pulses present 1+ bilaterally  Posterior tibial pulses absent bilaterally  Pedal hair growth absent. Capillary refill normal to all digits.  No cyanosis or clubbing noted.  Neurologic Normal speech. Oriented to person, place, and time. Epicritic sensation to light touch grossly present bilaterally. Protective sensation with 5.07 monofilament  present bilaterally. Vibratory sensation present bilaterally.  Dermatologic Nails elongated, thickened, dystrophic. No open wounds. No skin lesions.  Orthopedic: Normal joint ROM without pain or crepitus bilaterally. No visible deformities. No bony tenderness.   Assessment:   1. Onychomycosis of multiple toenails with type 2 diabetes mellitus and peripheral angiopathy (HCC)    Plan:  Patient was evaluated and treated and all questions answered.  Diabetes with PAD, Onychomycosis -Nails debrided as below  Procedure: Nail Debridement Rationale: Patient meets criteria for routine foot care due to PAD Type of Debridement: manual, sharp debridement. Instrumentation: Nail nipper, rotary burr. Number of Nails: 10     No follow-ups on file.

## 2019-02-16 ENCOUNTER — Ambulatory Visit: Payer: Medicare Other | Admitting: Podiatry

## 2019-03-03 ENCOUNTER — Ambulatory Visit: Payer: Medicare Other | Admitting: Podiatry

## 2019-03-23 ENCOUNTER — Ambulatory Visit (INDEPENDENT_AMBULATORY_CARE_PROVIDER_SITE_OTHER): Payer: Medicare Other | Admitting: Podiatry

## 2019-03-23 ENCOUNTER — Other Ambulatory Visit: Payer: Self-pay

## 2019-03-23 DIAGNOSIS — B351 Tinea unguium: Secondary | ICD-10-CM | POA: Insufficient documentation

## 2019-03-23 DIAGNOSIS — G43909 Migraine, unspecified, not intractable, without status migrainosus: Secondary | ICD-10-CM | POA: Insufficient documentation

## 2019-03-23 DIAGNOSIS — N8500 Endometrial hyperplasia, unspecified: Secondary | ICD-10-CM | POA: Insufficient documentation

## 2019-03-23 DIAGNOSIS — E1151 Type 2 diabetes mellitus with diabetic peripheral angiopathy without gangrene: Secondary | ICD-10-CM

## 2019-03-23 DIAGNOSIS — N950 Postmenopausal bleeding: Secondary | ICD-10-CM | POA: Insufficient documentation

## 2019-03-23 DIAGNOSIS — N951 Menopausal and female climacteric states: Secondary | ICD-10-CM | POA: Insufficient documentation

## 2019-03-23 DIAGNOSIS — M1990 Unspecified osteoarthritis, unspecified site: Secondary | ICD-10-CM | POA: Insufficient documentation

## 2019-03-23 DIAGNOSIS — N952 Postmenopausal atrophic vaginitis: Secondary | ICD-10-CM | POA: Insufficient documentation

## 2019-03-23 DIAGNOSIS — E1169 Type 2 diabetes mellitus with other specified complication: Secondary | ICD-10-CM | POA: Diagnosis not present

## 2019-03-23 DIAGNOSIS — N6459 Other signs and symptoms in breast: Secondary | ICD-10-CM | POA: Insufficient documentation

## 2019-03-23 NOTE — Progress Notes (Signed)
Subjective:  Patient ID: Sandra Harvey, female    DOB: Jul 17, 1940,  MRN: [id number]  Chief Complaint  Patient presents with  . debride    Diabetic nail trimming  . Diabetes    FBS: "don't check" A1C: 6.1 PCP: Pharr x couple mo    78 y.o. female presents  for diabetic foot care. Last AMBS was unknown.  Last A1c 6.1.  Last saw PCP Dr. Shelia Media a couple months ago. Denies numbness and tingling in their feet. Denies cramping in legs and thighs.  Review of Systems: Negative except as noted in the HPI. Denies N/V/F/Ch.  Past Medical History:  Diagnosis Date  . Allergy   . Anemia   . Anxiety   . Arthritis   . Cataract   . Complication of anesthesia    slow to wake up   . Depression   . Diabetes mellitus    type II   . GERD (gastroesophageal reflux disease)   . Headache    hx of migraines   . History of migraine headaches   . History of osteopenia   . Hypercholesterolemia   . Hyperlipidemia   . Hypertension   . Hypothyroidism   . Plantar warts   . Stomach ulcer    diagnosed 08/2017     Current Outpatient Medications:  .  levothyroxine (SYNTHROID) 75 MCG tablet, Take 75 mcg by mouth daily before breakfast., Disp: , Rfl:  .  amLODipine (NORVASC) 2.5 MG tablet, Take 2.5 mg by mouth daily after breakfast., Disp: , Rfl: 3 .  baclofen (LIORESAL) 10 MG tablet, Take 10 mg by mouth 3 (three) times daily as needed (for severe migraine headaches.)., Disp: , Rfl:  .  cetirizine (ZYRTEC) 10 MG tablet, Take 10 mg by mouth daily as needed for allergies., Disp: , Rfl:  .  cholecalciferol (VITAMIN D) 1000 UNITS tablet, Take 1,000 Units by mouth 2 (two) times daily., Disp: , Rfl:  .  Cyanocobalamin (VITAMIN B-12) 2500 MCG SUBL, Place 2,500 mcg under the tongue daily., Disp: , Rfl:  .  cyclobenzaprine (FLEXERIL) 5 MG tablet, Take 1 tablet (5 mg total) by mouth 3 (three) times daily as needed., Disp: 30 tablet, Rfl: 0 .  dexlansoprazole (DEXILANT) 60 MG capsule, Take 60 mg by mouth daily before  breakfast. 30 minutes prior to breakfast, Disp: , Rfl:  .  docusate sodium (COLACE) 100 MG capsule, Take 1 capsule (100 mg total) by mouth 2 (two) times daily., Disp: 10 capsule, Rfl: 0 .  ferrous sulfate (FERROUSUL) 325 (65 FE) MG tablet, Take 1 tablet (325 mg total) by mouth 3 (three) times daily with meals., Disp: , Rfl: 3 .  hydrochlorothiazide (HYDRODIURIL) 25 MG tablet, Take 12.5 mg by mouth daily after breakfast., Disp: , Rfl:  .  HYDROcodone-acetaminophen (NORCO) 7.5-325 MG tablet, Take 1-2 tablets by mouth every 4 (four) hours as needed for moderate pain., Disp: 60 tablet, Rfl: 0 .  imipramine (TOFRANIL) 50 MG tablet, Take 50 mg by mouth at bedtime., Disp: , Rfl:  .  metFORMIN (GLUCOPHAGE-XR) 500 MG 24 hr tablet, Take 500 mg by mouth 2 (two) times daily after a meal., Disp: , Rfl:  .  pantoprazole (PROTONIX) 40 MG tablet, Take 40 mg by mouth daily before breakfast. , Disp: , Rfl:  .  polyethylene glycol (MIRALAX / GLYCOLAX) packet, Take 17 g by mouth 2 (two) times daily., Disp: 14 each, Rfl: 0 .  rosuvastatin (CRESTOR) 20 MG tablet, Take 20 mg by mouth daily after supper.,  Disp: , Rfl:  .  sertraline (ZOLOFT) 100 MG tablet, Take 100 mg by mouth daily after breakfast., Disp: , Rfl:  .  telmisartan (MICARDIS) 80 MG tablet, Take 80 mg by mouth daily after breakfast., Disp: , Rfl:   Social History   Tobacco Use  Smoking Status Former Smoker  . Packs/day: 1.00  . Years: 30.00  . Pack years: 30.00  . Types: Cigarettes  . Quit date: 11/07/1992  . Years since quitting: 26.3  Smokeless Tobacco Never Used    Allergies  Allergen Reactions  . Ibuprofen Nausea Only  . Meloxicam     Caused ulcer   . Nsaids Nausea And Vomiting    With prolonged use   Objective:   There were no vitals filed for this visit. There is no height or weight on file to calculate BMI. Constitutional Well developed. Well nourished.  Vascular Dorsalis pedis pulses present 1+ bilaterally  Posterior tibial  pulses absent bilaterally  Pedal hair growth absent. Capillary refill normal to all digits.  No cyanosis or clubbing noted.  Neurologic Normal speech. Oriented to person, place, and time. Epicritic sensation to light touch grossly present bilaterally. Protective sensation with 5.07 monofilament  present bilaterally. Vibratory sensation present bilaterally.  Dermatologic Nails elongated, thickened, dystrophic. No open wounds. No skin lesions.  Orthopedic: Normal joint ROM without pain or crepitus bilaterally. No visible deformities. No bony tenderness.   Assessment:   1. Onychomycosis of multiple toenails with type 2 diabetes mellitus and peripheral angiopathy (HCC)    Plan:  Patient was evaluated and treated and all questions answered.  Diabetes with PAD, Onychomycosis -Nails debrided as below  Procedure: Nail Debridement Rationale: Patient meets criteria for routine foot care due to PAD  Type of Debridement: manual, sharp debridement. Instrumentation: Nail nipper, rotary burr. Number of Nails: 10        No follow-ups on file.

## 2019-05-22 DIAGNOSIS — K922 Gastrointestinal hemorrhage, unspecified: Secondary | ICD-10-CM

## 2019-05-22 HISTORY — PX: UPPER GI ENDOSCOPY: SHX6162

## 2019-05-22 HISTORY — DX: Gastrointestinal hemorrhage, unspecified: K92.2

## 2019-05-25 ENCOUNTER — Other Ambulatory Visit: Payer: Self-pay

## 2019-05-25 ENCOUNTER — Ambulatory Visit (INDEPENDENT_AMBULATORY_CARE_PROVIDER_SITE_OTHER): Payer: Medicare Other | Admitting: Podiatry

## 2019-05-25 DIAGNOSIS — E1169 Type 2 diabetes mellitus with other specified complication: Secondary | ICD-10-CM | POA: Diagnosis not present

## 2019-05-25 DIAGNOSIS — B351 Tinea unguium: Secondary | ICD-10-CM

## 2019-05-25 DIAGNOSIS — E1151 Type 2 diabetes mellitus with diabetic peripheral angiopathy without gangrene: Secondary | ICD-10-CM | POA: Diagnosis not present

## 2019-05-25 NOTE — Progress Notes (Addendum)
  Subjective:  Patient ID: Sandra Harvey, female    DOB: 12/09/1940,  MRN: [id number]  Chief Complaint  Patient presents with  . debride    diabetic nail trimming  . Diabetes    FBS: na A1C: >7 PCP: Pharrx Oct    78 y.o. female presents with the above complaint. History confirmed with patient.   Objective:  Physical Exam: warm, good capillary refill, no trophic changes or ulcerative lesions, normal DP and non-palpable PT pulses and reduced sensation with monofilament. Left Foot: normal exam, no swelling, tenderness, instability; ligaments intact, full range of motion of all ankle/foot joints  Right Foot: normal exam, no swelling, tenderness, instability; ligaments intact, full range of motion of all ankle/foot joints   No images are attached to the encounter.  Assessment:   1. Onychomycosis of multiple toenails with type 2 diabetes mellitus and peripheral angiopathy (HCC)      Plan:  Patient was evaluated and treated and all questions answered.  Onychomycosis and PAD -Patient is diabetic with a qualifying condition for at risk foot care.  Procedure: Nail Debridement Rationale: Patient meets criteria for routine foot care due to PAD Type of Debridement: manual, sharp debridement. Instrumentation: Nail nipper, rotary burr. Number of Nails: 10   Return in about 3 months (around 08/23/2019) for Diabetic Foot Care.   MDM

## 2019-06-16 ENCOUNTER — Other Ambulatory Visit: Payer: Self-pay | Admitting: Obstetrics and Gynecology

## 2019-06-16 DIAGNOSIS — Z1231 Encounter for screening mammogram for malignant neoplasm of breast: Secondary | ICD-10-CM

## 2019-08-28 ENCOUNTER — Ambulatory Visit: Payer: Medicare Other

## 2019-09-01 ENCOUNTER — Ambulatory Visit (INDEPENDENT_AMBULATORY_CARE_PROVIDER_SITE_OTHER): Payer: Medicare Other | Admitting: Podiatry

## 2019-09-01 ENCOUNTER — Other Ambulatory Visit: Payer: Self-pay

## 2019-09-01 DIAGNOSIS — E1169 Type 2 diabetes mellitus with other specified complication: Secondary | ICD-10-CM

## 2019-09-01 DIAGNOSIS — E1151 Type 2 diabetes mellitus with diabetic peripheral angiopathy without gangrene: Secondary | ICD-10-CM

## 2019-09-01 DIAGNOSIS — B351 Tinea unguium: Secondary | ICD-10-CM

## 2019-09-02 ENCOUNTER — Other Ambulatory Visit: Payer: Self-pay | Admitting: Gastroenterology

## 2019-09-14 ENCOUNTER — Ambulatory Visit: Payer: Medicare Other

## 2019-09-15 ENCOUNTER — Other Ambulatory Visit (HOSPITAL_COMMUNITY): Admission: RE | Admit: 2019-09-15 | Payer: Medicare Other | Source: Ambulatory Visit

## 2019-09-18 ENCOUNTER — Ambulatory Visit (HOSPITAL_COMMUNITY): Admission: RE | Admit: 2019-09-18 | Payer: Medicare Other | Source: Home / Self Care | Admitting: Gastroenterology

## 2019-09-18 SURGERY — ESOPHAGOGASTRODUODENOSCOPY (EGD) WITH PROPOFOL
Anesthesia: Monitor Anesthesia Care

## 2019-09-21 ENCOUNTER — Other Ambulatory Visit: Payer: Self-pay | Admitting: Gastroenterology

## 2019-10-13 ENCOUNTER — Other Ambulatory Visit (HOSPITAL_COMMUNITY)
Admission: RE | Admit: 2019-10-13 | Discharge: 2019-10-13 | Disposition: A | Discharge status: Home / Self Care | Payer: Medicare Other | Source: Ambulatory Visit | Attending: Gastroenterology | Admitting: Gastroenterology

## 2019-10-13 DIAGNOSIS — Z01812 Encounter for preprocedural laboratory examination: Secondary | ICD-10-CM | POA: Diagnosis present

## 2019-10-13 DIAGNOSIS — Z20822 Contact with and (suspected) exposure to covid-19: Secondary | ICD-10-CM | POA: Insufficient documentation

## 2019-10-13 LAB — SARS CORONAVIRUS 2 (TAT 6-24 HRS): SARS Coronavirus 2: NEGATIVE

## 2019-10-15 NOTE — Progress Notes (Signed)
Pre-op call complete for endo procedure tomorrow. Patient has been quarantined since COVID test and has no symptoms of feeling ill. Patient confirmed will have a ride home, all questions addressed.

## 2019-10-16 ENCOUNTER — Encounter (HOSPITAL_COMMUNITY): Payer: Self-pay | Admitting: Gastroenterology

## 2019-10-16 ENCOUNTER — Encounter (HOSPITAL_COMMUNITY)
Admission: RE | Disposition: A | Discharge status: Home / Self Care | Payer: Self-pay | Source: Home / Self Care | Attending: Gastroenterology

## 2019-10-16 ENCOUNTER — Ambulatory Visit (HOSPITAL_COMMUNITY): Payer: Medicare Other | Admitting: Anesthesiology

## 2019-10-16 ENCOUNTER — Other Ambulatory Visit: Payer: Self-pay

## 2019-10-16 ENCOUNTER — Ambulatory Visit (HOSPITAL_COMMUNITY)
Admission: RE | Admit: 2019-10-16 | Discharge: 2019-10-16 | Disposition: A | Discharge status: Home / Self Care | Payer: Medicare Other | Attending: Gastroenterology | Admitting: Gastroenterology

## 2019-10-16 DIAGNOSIS — M1990 Unspecified osteoarthritis, unspecified site: Secondary | ICD-10-CM | POA: Insufficient documentation

## 2019-10-16 DIAGNOSIS — Z87891 Personal history of nicotine dependence: Secondary | ICD-10-CM | POA: Diagnosis not present

## 2019-10-16 DIAGNOSIS — K449 Diaphragmatic hernia without obstruction or gangrene: Secondary | ICD-10-CM | POA: Diagnosis not present

## 2019-10-16 DIAGNOSIS — D509 Iron deficiency anemia, unspecified: Secondary | ICD-10-CM | POA: Diagnosis present

## 2019-10-16 DIAGNOSIS — K319 Disease of stomach and duodenum, unspecified: Secondary | ICD-10-CM | POA: Diagnosis not present

## 2019-10-16 DIAGNOSIS — Z7984 Long term (current) use of oral hypoglycemic drugs: Secondary | ICD-10-CM | POA: Insufficient documentation

## 2019-10-16 DIAGNOSIS — E1136 Type 2 diabetes mellitus with diabetic cataract: Secondary | ICD-10-CM | POA: Diagnosis not present

## 2019-10-16 DIAGNOSIS — I10 Essential (primary) hypertension: Secondary | ICD-10-CM | POA: Diagnosis not present

## 2019-10-16 DIAGNOSIS — K5730 Diverticulosis of large intestine without perforation or abscess without bleeding: Secondary | ICD-10-CM | POA: Insufficient documentation

## 2019-10-16 DIAGNOSIS — K2970 Gastritis, unspecified, without bleeding: Secondary | ICD-10-CM | POA: Diagnosis not present

## 2019-10-16 DIAGNOSIS — Z96641 Presence of right artificial hip joint: Secondary | ICD-10-CM | POA: Diagnosis not present

## 2019-10-16 HISTORY — PX: ENTEROSCOPY: SHX5533

## 2019-10-16 HISTORY — PX: BIOPSY: SHX5522

## 2019-10-16 HISTORY — PX: COLONOSCOPY WITH PROPOFOL: SHX5780

## 2019-10-16 LAB — GLUCOSE, CAPILLARY: Glucose-Capillary: 99 mg/dL (ref 70–99)

## 2019-10-16 SURGERY — ENTEROSCOPY
Anesthesia: Monitor Anesthesia Care

## 2019-10-16 SURGERY — COLONOSCOPY WITH PROPOFOL
Anesthesia: Monitor Anesthesia Care

## 2019-10-16 MED ORDER — PROPOFOL 500 MG/50ML IV EMUL
INTRAVENOUS | Status: AC
  Filled 2019-10-16: qty 350

## 2019-10-16 MED ORDER — PROPOFOL 1000 MG/100ML IV EMUL
INTRAVENOUS | Status: AC
  Filled 2019-10-16: qty 200

## 2019-10-16 MED ORDER — SODIUM CHLORIDE 0.9 % IV SOLN: INTRAVENOUS | Status: DC

## 2019-10-16 MED ORDER — LACTATED RINGERS IV SOLN
INTRAVENOUS | Status: DC
  Administered 2019-10-16: 1000 mL via INTRAVENOUS

## 2019-10-16 MED ORDER — PHENYLEPHRINE 40 MCG/ML (10ML) SYRINGE FOR IV PUSH (FOR BLOOD PRESSURE SUPPORT)
PREFILLED_SYRINGE | INTRAVENOUS | Status: DC | PRN
  Administered 2019-10-16: 120 ug via INTRAVENOUS
  Administered 2019-10-16: 200 ug via INTRAVENOUS

## 2019-10-16 MED ORDER — LIDOCAINE 2% (20 MG/ML) 5 ML SYRINGE
INTRAMUSCULAR | Status: DC | PRN
  Administered 2019-10-16: 100 mg via INTRAVENOUS

## 2019-10-16 MED ORDER — PROPOFOL 500 MG/50ML IV EMUL
INTRAVENOUS | Status: DC | PRN
  Administered 2019-10-16: 100 ug/kg/min via INTRAVENOUS

## 2019-10-16 MED ORDER — PROPOFOL 10 MG/ML IV BOLUS
INTRAVENOUS | Status: DC | PRN
  Administered 2019-10-16: 30 mg via INTRAVENOUS
  Administered 2019-10-16 (×6): 20 mg via INTRAVENOUS

## 2019-10-16 SURGICAL SUPPLY — 25 items

## 2019-10-16 NOTE — Op Note (Signed)
[facility] Patient Name: Sandra Harvey Procedure Date: 10/16/2019 MRN: [id number] Attending MD: Jeani Hawking , MD Date of Birth: 08/24/1940 CSN: [id number] Age: 79 Admit Type: Outpatient Procedure:                Colonoscopy Indications:              Iron deficiency anemia Providers:                Jeani Hawking, MD, Dayton Bailiff, RN, Wanita Chamberlain,                            Technician, Heron Nay, CRNA Referring MD:              Medicines:                Propofol per Anesthesia Complications:            No immediate complications. Estimated Blood Loss:     Estimated blood loss: none. Procedure:                Pre-Anesthesia Assessment:                           - Prior to the procedure, a History and Physical                            was performed, and patient medications and                            allergies were reviewed. The patient's tolerance of                            previous anesthesia was also reviewed. The risks                            and benefits of the procedure and the sedation                            options and risks were discussed with the patient.                            All questions were answered, and informed consent                            was obtained. Prior Anticoagulants: The patient has                            taken no previous anticoagulant or antiplatelet                            agents. ASA Grade Assessment: III - A patient with                            severe systemic disease. After reviewing the risks  and benefits, the patient was deemed in                            satisfactory condition to undergo the procedure.                           - Sedation was administered by an anesthesia                            professional. Deep sedation was attained.                           After obtaining informed consent, the colonoscope                            was passed under direct  vision. Throughout the                            procedure, the patient's blood pressure, pulse, and                            oxygen saturations were monitored continuously. The                            PCF-H190DL ([id number] Olympus pediatric colonscope                            was introduced through the anus and advanced to the                            the cecum, identified by appendiceal orifice and                            ileocecal valve. The colonoscopy was performed                            without difficulty. The patient tolerated the                            procedure well. The quality of the bowel                            preparation was good. The ileocecal valve,                            appendiceal orifice, and rectum were photographed. Scope In: 11:42:05 AM Scope Out: 11:53:25 AM Scope Withdrawal Time: 0 hours 5 minutes 16 seconds  Total Procedure Duration: 0 hours 11 minutes 20 seconds  Findings:      Scattered small and large-mouthed diverticula were found in the sigmoid       colon. Impression:               - Diverticulosis in the sigmoid colon.                           -  No specimens collected. Moderate Sedation:      Not Applicable - Patient had care per Anesthesia. Recommendation:           - Patient has a contact number available for                            emergencies. The signs and symptoms of potential                            delayed complications were discussed with the                            patient. Return to normal activities tomorrow.                            Written discharge instructions were provided to the                            patient.                           - Resume previous diet.                           - Continue present medications.                           - Repeat colonoscopy is not recommended due to                            current age (21 years or older) for surveillance. Procedure Code(s):         --- Professional ---                           [phone], Colonoscopy, flexible; diagnostic, including                            collection of specimen(s) by brushing or washing,                            when performed (separate procedure) Diagnosis Code(s):        --- Professional ---                           D50.9, Iron deficiency anemia, unspecified                           K57.30, Diverticulosis of large intestine without                            perforation or abscess without bleeding CPT copyright 2019 [employer]. All rights reserved. The codes documented in this report are preliminary and upon coder review may  be revised to meet current compliance requirements. Carol Ada, MD Carol Ada, MD 10/16/2019 11:57:05 AM This report has been signed electronically. Number of Addenda: 0

## 2019-10-16 NOTE — H&P (Signed)
  Sandra Harvey HPI: Prior endoscopic work up in the past showed that she had NSAID-induced ulcerations. The plain was for her to remain on a PPI indefinitely and to avoid NSAIDs. The HGB in 2019 just at the time of discharge was 9.4 g/dL, which was from her hip surgery. The current HGB is at 11.1 g/dL and her iron saturation is at 8%. A colonoscopy was planned at the time of the EGD in 2019, but it was cancelled as a result of respiratory instability when the EGD. Her colonoscopy in 2015 was normal. She denies any issues with chest pain, SOB, or MI, however, she feels that she suffers with fatigue. The fatigue was the symptom that prompted the current work up.  Past Medical History:  Diagnosis Date  . Allergy   . Anemia   . Anxiety   . Arthritis   . Cataract   . Complication of anesthesia    slow to wake up   . Depression   . Diabetes mellitus    type II   . GERD (gastroesophageal reflux disease)   . Headache    hx of migraines   . History of migraine headaches   . History of osteopenia   . Hypercholesterolemia   . Hyperlipidemia   . Hypertension   . Hypothyroidism   . Plantar warts   . Stomach ulcer    diagnosed 08/2017     Past Surgical History:  Procedure Laterality Date  . CORNEAL TRANSPLANT  2007, 2010  . DILATION AND CURETTAGE OF UTERUS    . EYE SURGERY    . HAND SURGERY  02/2011  . left carpal tunnel release     . right foot morton's neuroma surgery     . tendonitis right arm surgery     . THYROIDECTOMY, PARTIAL    . 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 UMBILICAL HERNIA REPAIR      Family History  Problem Relation Age of Onset  . Diabetes Sister   . Hypertension Sister   . Breast cancer Neg Hx     Social History:  reports that she quit smoking about 26 years ago. Her smoking use included  cigarettes. She has a 30.00 pack-year smoking history. She has never used smokeless tobacco. She reports current alcohol use of about 1.0 standard drinks of alcohol per week. She reports that she does not use drugs.  Allergies:  Allergies  Allergen Reactions  . Ibuprofen Nausea Only  . Meloxicam     Caused ulcer   . Nsaids Nausea And Vomiting    With prolonged use    Medications: Scheduled: Continuous:  No results found for this or any previous visit (from the past 24 hour(s)).   No results found.  ROS:  As stated above in the HPI otherwise negative.  Height 4' 11.5" (1.511 m), weight 58.5 kg.    PE: Gen: NAD, Alert and Oriented HEENT:  [place]/AT, EOMI Neck: Supple, no LAD Lungs: CTA Bilaterally CV: RRR without M/G/R ABD: Soft, NTND, +BS Ext: No C/C/E  Assessment/Plan: 1) IDA - Enteroscopy and colonoscopy.  Kendel Pesnell D 10/16/2019, 9:44 AM

## 2019-10-16 NOTE — Transfer of Care (Signed)
Immediate Anesthesia Transfer of Care Note  Patient: Sandra Harvey  Procedure(s) Performed: COLONOSCOPY WITH PROPOFOL (N/A ) ENTEROSCOPY (N/A ) BIOPSY  Patient Location: PACU and Endoscopy Unit  Anesthesia Type:MAC  Level of Consciousness: awake and alert   Airway & Oxygen Therapy: Patient Spontanous Breathing and Patient connected to face mask oxygen  Post-op Assessment: Report given to RN and Post -op Vital signs reviewed and stable  Post vital signs: Reviewed and stable  Last Vitals:  Vitals Value Taken Time  BP 106/33 10/16/19 1200  Temp    Pulse 68 10/16/19 1201  Resp 16 10/16/19 1201  SpO2 100 % 10/16/19 1201  Vitals shown include unvalidated device data.  Last Pain:  Vitals:   10/16/19 0952  TempSrc: Oral  PainSc: 0-No pain         Complications: No apparent anesthesia complications

## 2019-10-16 NOTE — Anesthesia Postprocedure Evaluation (Signed)
Anesthesia Post Note  Patient: Sandra Harvey  Procedure(s) Performed: COLONOSCOPY WITH PROPOFOL (N/A ) ENTEROSCOPY (N/A ) BIOPSY     Patient location during evaluation: Endoscopy Anesthesia Type: MAC Level of consciousness: awake and alert Pain management: pain level controlled Vital Signs Assessment: post-procedure vital signs reviewed and stable Respiratory status: spontaneous breathing, nonlabored ventilation and respiratory function stable Cardiovascular status: blood pressure returned to baseline and stable Postop Assessment: no apparent nausea or vomiting Anesthetic complications: no    Last Vitals:  Vitals:   10/16/19 1205 10/16/19 1211  BP: (!) 129/42 (!) 126/44  Pulse: 67 67  Resp: 19 15  Temp:    SpO2: 100% 100%    Last Pain:  Vitals:   10/16/19 1211  TempSrc:   PainSc: 0-No pain                 Lidia Collum

## 2019-10-16 NOTE — Anesthesia Preprocedure Evaluation (Addendum)
Anesthesia Evaluation  Patient identified by MRN, date of birth, ID band Patient awake    Reviewed: Allergy & Precautions, NPO status , Patient's Chart, lab work & pertinent test results  History of Anesthesia Complications Negative for: history of anesthetic complications  Airway Mallampati: II  TM Distance: >3 FB Neck ROM: Full    Dental   Pulmonary neg pulmonary ROS, former smoker,    Pulmonary exam normal        Cardiovascular hypertension, Normal cardiovascular exam     Neuro/Psych Anxiety Depression negative neurological ROS     GI/Hepatic Neg liver ROS, PUD, GERD  ,  Endo/Other  diabetes, Type 2, Oral Hypoglycemic AgentsHypothyroidism   Renal/GU negative Renal ROS  negative genitourinary   Musculoskeletal  (+) Arthritis ,   Abdominal   Peds  Hematology  (+) anemia ,   Anesthesia Other Findings   Reproductive/Obstetrics                            Anesthesia Physical Anesthesia Plan  ASA: III  Anesthesia Plan: MAC   Post-op Pain Management:    Induction: Intravenous  PONV Risk Score and Plan: 2 and Propofol infusion, TIVA and Treatment may vary due to age or medical condition  Airway Management Planned: Natural Airway, Nasal Cannula and Simple Face Mask  Additional Equipment: None  Intra-op Plan:   Post-operative Plan:   Informed Consent: I have reviewed the patients History and Physical, chart, labs and discussed the procedure including the risks, benefits and alternatives for the proposed anesthesia with the patient or authorized representative who has indicated his/her understanding and acceptance.       Plan Discussed with:   Anesthesia Plan Comments:         Anesthesia Quick Evaluation

## 2019-10-16 NOTE — Discharge Instructions (Signed)

## 2019-10-16 NOTE — Op Note (Signed)
[facility] Patient Name: Sandra Harvey Procedure Date: 10/16/2019 MRN: [id number] Attending MD: Jeani Hawking , MD Date of Birth: 04/23/1941 CSN: [id number] Age: 79 Admit Type: Outpatient Procedure:                Small bowel enteroscopy Indications:              Iron deficiency anemia Providers:                Jeani Hawking, MD, Dayton Bailiff, RN, Wanita Chamberlain,                            Technician, Heron Nay, CRNA Referring MD:              Medicines:                Propofol per Anesthesia Complications:            No immediate complications. Estimated Blood Loss:     Estimated blood loss: none. Procedure:                Pre-Anesthesia Assessment:                           - Prior to the procedure, a History and Physical                            was performed, and patient medications and                            allergies were reviewed. The patient's tolerance of                            previous anesthesia was also reviewed. The risks                            and benefits of the procedure and the sedation                            options and risks were discussed with the patient.                            All questions were answered, and informed consent                            was obtained. Prior Anticoagulants: The patient has                            taken no previous anticoagulant or antiplatelet                            agents. ASA Grade Assessment: III - A patient with                            severe systemic disease. After reviewing the risks  and benefits, the patient was deemed in                            satisfactory condition to undergo the procedure.                           - Sedation was administered by an anesthesia                            professional. Deep sedation was attained.                           After obtaining informed consent, the endoscope was                            passed  under direct vision. Throughout the                            procedure, the patient's blood pressure, pulse, and                            oxygen saturations were monitored continuously. The                            [id number]-[id number] ([id number] Olympus pediatric colonscope                            was introduced through the mouth and advanced to                            the small bowel distal to the Ligament of Treitz.                            The small bowel enteroscopy was accomplished                            without difficulty. The patient tolerated the                            procedure well. Scope In: Scope Out: Findings:      A 2 cm hiatal hernia was present.      Localized moderate inflammation characterized by erythema was found in       the gastric body and in the gastric antrum. Biopsies were taken with a       cold forceps for histology.      The examined duodenum was normal.      There was no evidence of significant pathology in the entire examined       portion of jejunum. Impression:               - 2 cm hiatal hernia.                           - Gastritis. Biopsied.                           -  Normal examined duodenum.                           - The examined portion of the jejunum was normal. Recommendation:           - Patient has a contact number available for                            emergencies. The signs and symptoms of potential                            delayed complications were discussed with the                            patient. Return to normal activities tomorrow.                            Written discharge instructions were provided to the                            patient.                           - Resume previous diet. Procedure Code(s):        --- Professional ---                           [phone], Small intestinal endoscopy, enteroscopy                            beyond second portion of duodenum, not including                             ileum; with biopsy, single or multiple Diagnosis Code(s):        --- Professional ---                           K29.70, Gastritis, unspecified, without bleeding                           K44.9, Diaphragmatic hernia without obstruction or                            gangrene                           D50.9, Iron deficiency anemia, unspecified CPT copyright 2019 [employer]. All rights reserved. The codes documented in this report are preliminary and upon coder review may  be revised to meet current compliance requirements. Jeani Hawking, MD Jeani Hawking, MD 10/16/2019 12:00:10 PM This report has been signed electronically. Number of Addenda: 0

## 2019-10-20 ENCOUNTER — Other Ambulatory Visit: Payer: Self-pay

## 2019-10-20 ENCOUNTER — Encounter: Payer: Self-pay | Admitting: *Deleted

## 2019-10-20 LAB — SURGICAL PATHOLOGY

## 2019-11-03 DIAGNOSIS — D649 Anemia, unspecified: Secondary | ICD-10-CM | POA: Insufficient documentation

## 2019-11-16 NOTE — Progress Notes (Signed)
  Subjective:  Patient ID: Sandra Harvey, female    DOB: [birth year],  MRN: [id number]  Chief Complaint  Patient presents with  . debride    diabetic nail trimming  . Diabetes    FBS: na A1C: 6.7 aPC:: Pharr x Wed    79 y.o. female presents with the above complaint. History confirmed with patient.   Objective:  Physical Exam: warm, good capillary refill, no trophic changes or ulcerative lesions, normal DP and non-palpable PT pulses and reduced sensation with monofilament. Left Foot: normal exam, no swelling, tenderness, instability; ligaments intact, full range of motion of all ankle/foot joints  Right Foot: normal exam, no swelling, tenderness, instability; ligaments intact, full range of motion of all ankle/foot joints   No images are attached to the encounter.  Assessment:   1. Onychomycosis of multiple toenails with type 2 diabetes mellitus and peripheral angiopathy (HCC)      Plan:  Patient was evaluated and treated and all questions answered.  Onychomycosis and PAD -Patient is diabetic with a qualifying condition for at risk foot care.   Procedure: Nail Debridement Rationale: Patient meets criteria for routine foot care due to PAD Type of Debridement: manual, sharp debridement. Instrumentation: Nail nipper, rotary burr. Number of Nails: 10     No follow-ups on file.

## 2019-11-18 ENCOUNTER — Other Ambulatory Visit: Payer: Self-pay

## 2019-11-18 ENCOUNTER — Ambulatory Visit
Admission: RE | Admit: 2019-11-18 | Discharge: 2019-11-18 | Disposition: A | Discharge status: Home / Self Care | Payer: Medicare Other | Source: Ambulatory Visit | Attending: Obstetrics and Gynecology | Admitting: Obstetrics and Gynecology

## 2019-11-18 DIAGNOSIS — Z1231 Encounter for screening mammogram for malignant neoplasm of breast: Secondary | ICD-10-CM

## 2019-12-01 ENCOUNTER — Other Ambulatory Visit: Payer: Self-pay

## 2019-12-01 ENCOUNTER — Encounter: Payer: Self-pay | Admitting: Podiatry

## 2019-12-01 ENCOUNTER — Ambulatory Visit (INDEPENDENT_AMBULATORY_CARE_PROVIDER_SITE_OTHER): Payer: Medicare Other | Admitting: Podiatry

## 2019-12-01 DIAGNOSIS — B351 Tinea unguium: Secondary | ICD-10-CM | POA: Diagnosis not present

## 2019-12-01 DIAGNOSIS — E1169 Type 2 diabetes mellitus with other specified complication: Secondary | ICD-10-CM | POA: Diagnosis not present

## 2019-12-01 DIAGNOSIS — E1151 Type 2 diabetes mellitus with diabetic peripheral angiopathy without gangrene: Secondary | ICD-10-CM

## 2019-12-01 DIAGNOSIS — M79674 Pain in right toe(s): Secondary | ICD-10-CM

## 2019-12-01 DIAGNOSIS — M79675 Pain in left toe(s): Secondary | ICD-10-CM

## 2019-12-01 NOTE — Progress Notes (Signed)
°  Subjective:  Patient ID: Sandra Harvey, female    DOB: 21-Mar-1941,  MRN: [id number]  Chief Complaint  Patient presents with   debride    [facility]   Diabetes    FBS: 116 x few months ago A1C: 6.7 PCP: Pharr x 3 mo    79 y.o. female presents with the above complaint. History confirmed with patient.   Objective:  Physical Exam: warm, good capillary refill, no trophic changes or ulcerative lesions, normal DP and PT pulses and reduced sensation with monofilament. Left Foot: normal exam, no swelling, tenderness, instability; ligaments intact, full range of motion of all ankle/foot joints  Right Foot: normal exam, no swelling, tenderness, instability; ligaments intact, full range of motion of all ankle/foot joints    Assessment:   1. Onychomycosis of multiple toenails with type 2 diabetes mellitus and peripheral angiopathy (HCC)   2. Diabetes mellitus type 2 with peripheral artery disease (HCC)   3. Pain due to onychomycosis of toenails of both feet      Plan:  Patient was evaluated and treated and all questions answered.  Patient educated on diabetes. Discussed proper diabetic foot care and discussed risks and complications of disease. Educated patient in depth on reasons to return to the office immediately should he/she discover anything concerning or new on the feet. All questions answered. Discussed proper shoes as well.    Discussed the etiology and treatment options for the condition in detail with the patient. Educated patient on the topical and oral treatment options for mycotic nails. Recommended debridement of the nails today. Sharp and mechanical debridement performed of all painful and mycotic nails today. Nails debrided in length and thickness using a nail nipper and a mechanical burr to level of comfort. Discussed treatment options including appropriate shoe gear. Follow up as needed for painful nails.    Return in about 3 months (around 03/02/2020) for [facility].

## 2020-03-03 ENCOUNTER — Other Ambulatory Visit: Payer: Self-pay

## 2020-03-03 ENCOUNTER — Ambulatory Visit (INDEPENDENT_AMBULATORY_CARE_PROVIDER_SITE_OTHER): Payer: Medicare Other | Admitting: Podiatry

## 2020-03-03 ENCOUNTER — Encounter: Payer: Self-pay | Admitting: Podiatry

## 2020-03-03 DIAGNOSIS — E1169 Type 2 diabetes mellitus with other specified complication: Secondary | ICD-10-CM

## 2020-03-03 DIAGNOSIS — B351 Tinea unguium: Secondary | ICD-10-CM

## 2020-03-03 DIAGNOSIS — E1151 Type 2 diabetes mellitus with diabetic peripheral angiopathy without gangrene: Secondary | ICD-10-CM

## 2020-03-03 NOTE — Progress Notes (Signed)
  Subjective:  Patient ID: Sandra Harvey, female    DOB: 14-Dec-1940,  MRN: [id number]  Chief Complaint  Patient presents with  . Nail Problem    trim nails     79 y.o. female presents for at risk foot care. Denies new personal medical issue.  Her husband Arthrur just recently passed away but she is in good spirits. Objective:  Physical Exam: warm, good capillary refill, no trophic changes or ulcerative lesions, normal DP and non-palpable PT pulses and reduced sensation with monofilament. Left Foot: normal exam, no swelling, tenderness, instability; ligaments intact, full range of motion of all ankle/foot joints  Right Foot: normal exam, no swelling, tenderness, instability; ligaments intact, full range of motion of all ankle/foot joints   No images are attached to the encounter.  Assessment:   1. Onychomycosis of multiple toenails with type 2 diabetes mellitus and peripheral angiopathy (HCC)      Plan:  Patient was evaluated and treated and all questions answered.  Onychomycosis and PAD -Patient is diabetic with a qualifying condition for at risk foot care.   Procedure: Nail Debridement Rationale: Patient meets criteria for routine foot care due to PAD Type of Debridement: manual, sharp debridement. Instrumentation: Nail nipper, rotary burr. Number of Nails: 10        No follow-ups on file.

## 2020-03-13 DIAGNOSIS — M25562 Pain in left knee: Secondary | ICD-10-CM | POA: Insufficient documentation

## 2020-03-13 DIAGNOSIS — M25552 Pain in left hip: Secondary | ICD-10-CM | POA: Insufficient documentation

## 2020-06-06 ENCOUNTER — Ambulatory Visit: Payer: Medicare Other | Admitting: Podiatry

## 2020-10-03 DIAGNOSIS — M810 Age-related osteoporosis without current pathological fracture: Secondary | ICD-10-CM | POA: Insufficient documentation

## 2020-10-13 ENCOUNTER — Other Ambulatory Visit: Payer: Self-pay | Admitting: Internal Medicine

## 2020-10-13 DIAGNOSIS — R109 Unspecified abdominal pain: Secondary | ICD-10-CM

## 2020-10-28 ENCOUNTER — Ambulatory Visit
Admission: RE | Admit: 2020-10-28 | Discharge: 2020-10-28 | Disposition: A | Discharge status: Home / Self Care | Payer: Medicare Other | Source: Ambulatory Visit | Attending: Internal Medicine | Admitting: Internal Medicine

## 2020-10-28 ENCOUNTER — Other Ambulatory Visit: Payer: Self-pay

## 2020-10-28 DIAGNOSIS — R109 Unspecified abdominal pain: Secondary | ICD-10-CM

## 2020-12-22 DIAGNOSIS — F418 Other specified anxiety disorders: Secondary | ICD-10-CM | POA: Insufficient documentation

## 2020-12-22 DIAGNOSIS — N189 Chronic kidney disease, unspecified: Secondary | ICD-10-CM | POA: Insufficient documentation

## 2020-12-22 DIAGNOSIS — K4090 Unilateral inguinal hernia, without obstruction or gangrene, not specified as recurrent: Secondary | ICD-10-CM | POA: Insufficient documentation

## 2020-12-26 ENCOUNTER — Other Ambulatory Visit: Payer: Self-pay | Admitting: Obstetrics and Gynecology

## 2020-12-26 DIAGNOSIS — Z1231 Encounter for screening mammogram for malignant neoplasm of breast: Secondary | ICD-10-CM

## 2020-12-29 ENCOUNTER — Other Ambulatory Visit: Payer: Self-pay

## 2020-12-29 ENCOUNTER — Ambulatory Visit
Admission: RE | Admit: 2020-12-29 | Discharge: 2020-12-29 | Disposition: A | Discharge status: Home / Self Care | Payer: Medicare Other | Source: Ambulatory Visit | Attending: Obstetrics and Gynecology | Admitting: Obstetrics and Gynecology

## 2020-12-29 DIAGNOSIS — Z1231 Encounter for screening mammogram for malignant neoplasm of breast: Secondary | ICD-10-CM

## 2021-01-30 ENCOUNTER — Ambulatory Visit (INDEPENDENT_AMBULATORY_CARE_PROVIDER_SITE_OTHER): Payer: Medicare Other | Admitting: Podiatry

## 2021-01-30 ENCOUNTER — Other Ambulatory Visit: Payer: Self-pay

## 2021-01-30 DIAGNOSIS — B351 Tinea unguium: Secondary | ICD-10-CM

## 2021-01-30 DIAGNOSIS — E1151 Type 2 diabetes mellitus with diabetic peripheral angiopathy without gangrene: Secondary | ICD-10-CM | POA: Diagnosis not present

## 2021-01-30 DIAGNOSIS — E1169 Type 2 diabetes mellitus with other specified complication: Secondary | ICD-10-CM

## 2021-01-30 NOTE — Progress Notes (Signed)
  Subjective:  Patient ID: Sandra Harvey, female    DOB: 09-Sep-1940,  MRN: [id number]  Chief Complaint  Patient presents with   debride    [facility] -FBS: dont check A1C: 6 PCP-Pharr x Aug    79 y.o. female presents for at risk foot care. Denies new issues. Objective:  Physical Exam: warm, good capillary refill, no trophic changes or ulcerative lesions, normal DP and non-palpable PT pulses and reduced sensation with monofilament. Left Foot: normal exam, no swelling, tenderness, instability; ligaments intact, full range of motion of all ankle/foot joints  Right Foot: normal exam, no swelling, tenderness, instability; ligaments intact, full range of motion of all ankle/foot joints   No images are attached to the encounter.  Assessment:   1. Onychomycosis of multiple toenails with type 2 diabetes mellitus and peripheral angiopathy (HCC)    Plan:  Patient was evaluated and treated and all questions answered.  Onychomycosis and PAD -Patient is diabetic with a qualifying condition for at risk foot care.  Procedure: Nail Debridement Type of Debridement: manual, sharp debridement. Instrumentation: Nail nipper, rotary burr. Number of Nails: 10  Return in about 3 months (around 05/01/2021) for Diabetic Foot Care.

## 2021-03-31 ENCOUNTER — Ambulatory Visit: Payer: Self-pay | Admitting: Surgery

## 2021-04-06 ENCOUNTER — Encounter (HOSPITAL_BASED_OUTPATIENT_CLINIC_OR_DEPARTMENT_OTHER): Payer: Self-pay | Admitting: Surgery

## 2021-04-06 ENCOUNTER — Other Ambulatory Visit: Payer: Self-pay

## 2021-04-06 DIAGNOSIS — K4020 Bilateral inguinal hernia, without obstruction or gangrene, not specified as recurrent: Secondary | ICD-10-CM

## 2021-04-06 HISTORY — DX: Bilateral inguinal hernia, without obstruction or gangrene, not specified as recurrent: K40.20

## 2021-04-06 NOTE — Progress Notes (Addendum)
Spoke w/ via phone for pre-op interview---pt Lab needs dos----  I stat  , ekg           Lab results------see below COVID test -----patient states asymptomatic no test needed Arrive at -------1245 pm 04-10-2021 NPO after MN NO Solid Food.  Clear liquids from MN until---1145 am Med rec completed Medications to take morning of surgery -----levothyroxine, sertraline, systane eye drop prn Diabetic medication ----none day of surgery Patient instructed no nail polish to be worn day of surgery Patient instructed to bring photo id and insurance card day of surgery Patient aware to have Driver (ride ) / caregiver    for 24 hours after surgery   sister in Medical illustrator will stay Patient Special Instructions -----pt given overnight stay instructions Pre-Op special Istructions -----none Patient verbalized understanding of instructions that were given at this phone interview. Patient denies shortness of breath, chest pain, fever, cough at this phone interview.   Pt monitoring bp bid and calling dr pharr office on 04-07-2021 to see if bp med will be restarted (bp off bp medication since July 2022 due to hypotension), patient to call and let pre op nurse Myana Schlup rn know on 04-07-2021 if dr pharr office restarted bp medication.  Addendum: pt called and bp medication 20 mg telemisardan is restarted today, pt instructed to take bo med daily thru Sunday and no bp med am of surgery, pt vocalized understanding  Addendum: last ekg dr Chrissie Noa pharr 12-16-2019 on chart Hemaglobin a 1c done  (5.8) 04-04-2021 dr Chrissie Noa pharr on chart

## 2021-04-10 ENCOUNTER — Ambulatory Visit (HOSPITAL_BASED_OUTPATIENT_CLINIC_OR_DEPARTMENT_OTHER)
Admission: RE | Admit: 2021-04-10 | Discharge: 2021-04-11 | Disposition: A | Discharge status: Home / Self Care | Payer: Medicare Other | Attending: Surgery | Admitting: Surgery

## 2021-04-10 ENCOUNTER — Encounter (HOSPITAL_BASED_OUTPATIENT_CLINIC_OR_DEPARTMENT_OTHER)
Admission: RE | Disposition: A | Discharge status: Home / Self Care | Payer: Self-pay | Source: Home / Self Care | Attending: Surgery

## 2021-04-10 ENCOUNTER — Ambulatory Visit (HOSPITAL_BASED_OUTPATIENT_CLINIC_OR_DEPARTMENT_OTHER): Payer: Medicare Other | Admitting: Anesthesiology

## 2021-04-10 ENCOUNTER — Encounter (HOSPITAL_BASED_OUTPATIENT_CLINIC_OR_DEPARTMENT_OTHER): Payer: Self-pay | Admitting: Surgery

## 2021-04-10 ENCOUNTER — Other Ambulatory Visit: Payer: Self-pay

## 2021-04-10 DIAGNOSIS — G709 Myoneural disorder, unspecified: Secondary | ICD-10-CM | POA: Diagnosis not present

## 2021-04-10 DIAGNOSIS — Z87891 Personal history of nicotine dependence: Secondary | ICD-10-CM | POA: Insufficient documentation

## 2021-04-10 DIAGNOSIS — Z7984 Long term (current) use of oral hypoglycemic drugs: Secondary | ICD-10-CM | POA: Diagnosis not present

## 2021-04-10 DIAGNOSIS — E119 Type 2 diabetes mellitus without complications: Secondary | ICD-10-CM | POA: Insufficient documentation

## 2021-04-10 DIAGNOSIS — Z8711 Personal history of peptic ulcer disease: Secondary | ICD-10-CM | POA: Insufficient documentation

## 2021-04-10 DIAGNOSIS — I10 Essential (primary) hypertension: Secondary | ICD-10-CM | POA: Insufficient documentation

## 2021-04-10 DIAGNOSIS — K219 Gastro-esophageal reflux disease without esophagitis: Secondary | ICD-10-CM | POA: Insufficient documentation

## 2021-04-10 DIAGNOSIS — F419 Anxiety disorder, unspecified: Secondary | ICD-10-CM | POA: Insufficient documentation

## 2021-04-10 DIAGNOSIS — E785 Hyperlipidemia, unspecified: Secondary | ICD-10-CM | POA: Diagnosis not present

## 2021-04-10 DIAGNOSIS — K4020 Bilateral inguinal hernia, without obstruction or gangrene, not specified as recurrent: Secondary | ICD-10-CM | POA: Diagnosis present

## 2021-04-10 DIAGNOSIS — M1990 Unspecified osteoarthritis, unspecified site: Secondary | ICD-10-CM | POA: Insufficient documentation

## 2021-04-10 DIAGNOSIS — F32A Depression, unspecified: Secondary | ICD-10-CM | POA: Insufficient documentation

## 2021-04-10 HISTORY — DX: Other signs and symptoms in breast: N64.59

## 2021-04-10 HISTORY — PX: INGUINAL HERNIA REPAIR: SHX194

## 2021-04-10 HISTORY — DX: Dry eye syndrome of unspecified lacrimal gland: H04.129

## 2021-04-10 HISTORY — DX: Endothelial corneal dystrophy, unspecified eye: H18.519

## 2021-04-10 LAB — GLUCOSE, CAPILLARY
Glucose-Capillary: 85 mg/dL (ref 70–99)
Glucose-Capillary: 119 mg/dL — ABNORMAL HIGH (ref 70–99)
Glucose-Capillary: 276 mg/dL — ABNORMAL HIGH (ref 70–99)

## 2021-04-10 LAB — POCT I-STAT, CHEM 8
BUN: 14 mg/dL (ref 8–23)
Calcium, Ion: 1.17 mmol/L (ref 1.15–1.40)
Chloride: 104 mmol/L (ref 98–111)
Creatinine, Ser: 1.1 mg/dL — ABNORMAL HIGH (ref 0.44–1.00)
Glucose, Bld: 87 mg/dL (ref 70–99)
HCT: 39 % (ref 36.0–46.0)
Hemoglobin: 13.3 g/dL (ref 12.0–15.0)
Potassium: 4 mmol/L (ref 3.5–5.1)
Sodium: 141 mmol/L (ref 135–145)
TCO2: 26 mmol/L (ref 22–32)

## 2021-04-10 SURGERY — REPAIR, HERNIA, INGUINAL, BILATERAL, LAPAROSCOPIC
Anesthesia: General | Site: Abdomen | Laterality: Bilateral

## 2021-04-10 MED ORDER — ONDANSETRON HCL 4 MG/2ML IJ SOLN: 4.0000 mg | Freq: Once | INTRAMUSCULAR | Status: DC | PRN

## 2021-04-10 MED ORDER — ENOXAPARIN SODIUM 40 MG/0.4ML IJ SOSY
PREFILLED_SYRINGE | INTRAMUSCULAR | Status: AC
  Filled 2021-04-10: qty 0.4

## 2021-04-10 MED ORDER — ONDANSETRON HCL 4 MG/2ML IJ SOLN: 4.0000 mg | Freq: Four times a day (QID) | INTRAMUSCULAR | Status: DC | PRN

## 2021-04-10 MED ORDER — ROSUVASTATIN CALCIUM 20 MG PO TABS
20.0000 mg | ORAL_TABLET | Freq: Every day | ORAL | Status: DC
  Administered 2021-04-10: 20 mg via ORAL
  Filled 2021-04-10: qty 1

## 2021-04-10 MED ORDER — CHLORHEXIDINE GLUCONATE CLOTH 2 % EX PADS: 6.0000 | MEDICATED_PAD | Freq: Once | CUTANEOUS | Status: DC

## 2021-04-10 MED ORDER — LIDOCAINE 2% (20 MG/ML) 5 ML SYRINGE
INTRAMUSCULAR | Status: AC
  Filled 2021-04-10: qty 5

## 2021-04-10 MED ORDER — BUPIVACAINE HCL 0.5 % IJ SOLN
INTRAMUSCULAR | Status: DC | PRN
  Administered 2021-04-10: 30 mL

## 2021-04-10 MED ORDER — DOCUSATE SODIUM 100 MG PO CAPS
100.0000 mg | ORAL_CAPSULE | Freq: Two times a day (BID) | ORAL | Status: DC
  Administered 2021-04-10: 100 mg via ORAL

## 2021-04-10 MED ORDER — CEFAZOLIN SODIUM-DEXTROSE 2-4 GM/100ML-% IV SOLN
2.0000 g | INTRAVENOUS | Status: AC
  Administered 2021-04-10: 2 g via INTRAVENOUS

## 2021-04-10 MED ORDER — DOCUSATE SODIUM 100 MG PO CAPS
ORAL_CAPSULE | ORAL | Status: AC
  Filled 2021-04-10: qty 1

## 2021-04-10 MED ORDER — OXYCODONE HCL 5 MG PO TABS: 5.0000 mg | ORAL_TABLET | Freq: Once | ORAL | Status: DC | PRN

## 2021-04-10 MED ORDER — DEXAMETHASONE SODIUM PHOSPHATE 4 MG/ML IJ SOLN
INTRAMUSCULAR | Status: DC | PRN
  Administered 2021-04-10: 5 mg via INTRAVENOUS

## 2021-04-10 MED ORDER — SERTRALINE HCL 100 MG PO TABS
100.0000 mg | ORAL_TABLET | Freq: Every day | ORAL | Status: DC
  Administered 2021-04-11: 100 mg via ORAL
  Filled 2021-04-10: qty 1

## 2021-04-10 MED ORDER — ACETAMINOPHEN 500 MG PO TABS
1000.0000 mg | ORAL_TABLET | ORAL | Status: AC
  Administered 2021-04-10: 1000 mg via ORAL

## 2021-04-10 MED ORDER — PHENYLEPHRINE 40 MCG/ML (10ML) SYRINGE FOR IV PUSH (FOR BLOOD PRESSURE SUPPORT)
PREFILLED_SYRINGE | INTRAVENOUS | Status: AC
  Filled 2021-04-10: qty 10

## 2021-04-10 MED ORDER — DEXAMETHASONE SODIUM PHOSPHATE 10 MG/ML IJ SOLN
INTRAMUSCULAR | Status: AC
  Filled 2021-04-10: qty 1

## 2021-04-10 MED ORDER — FENTANYL CITRATE (PF) 100 MCG/2ML IJ SOLN
INTRAMUSCULAR | Status: AC
  Filled 2021-04-10: qty 2

## 2021-04-10 MED ORDER — FENTANYL CITRATE (PF) 100 MCG/2ML IJ SOLN
25.0000 ug | INTRAMUSCULAR | Status: DC | PRN
Start: 2021-04-10 — End: 2021-04-11
  Administered 2021-04-10: 25 ug via INTRAVENOUS

## 2021-04-10 MED ORDER — BUPIVACAINE LIPOSOME 1.3 % IJ SUSP
INTRAMUSCULAR | Status: DC | PRN
  Administered 2021-04-10: 20 mL

## 2021-04-10 MED ORDER — SUGAMMADEX SODIUM 200 MG/2ML IV SOLN
INTRAVENOUS | Status: DC | PRN
  Administered 2021-04-10: 200 mg via INTRAVENOUS

## 2021-04-10 MED ORDER — IRBESARTAN 75 MG PO TABS
75.0000 mg | ORAL_TABLET | Freq: Every day | ORAL | Status: DC
  Administered 2021-04-10: 75 mg via ORAL
  Filled 2021-04-10: qty 1

## 2021-04-10 MED ORDER — PHENYLEPHRINE HCL (PRESSORS) 10 MG/ML IV SOLN
INTRAVENOUS | Status: DC | PRN
  Administered 2021-04-10 (×3): 80 ug via INTRAVENOUS

## 2021-04-10 MED ORDER — ACETAMINOPHEN 325 MG PO TABS
ORAL_TABLET | ORAL | Status: AC
  Filled 2021-04-10: qty 2

## 2021-04-10 MED ORDER — HYDROMORPHONE HCL 1 MG/ML IJ SOLN: 0.5000 mg | INTRAMUSCULAR | Status: DC | PRN

## 2021-04-10 MED ORDER — EPHEDRINE 5 MG/ML INJ
INTRAVENOUS | Status: AC
  Filled 2021-04-10: qty 5

## 2021-04-10 MED ORDER — INSULIN ASPART 100 UNIT/ML IJ SOLN
0.0000 [IU] | Freq: Every day | INTRAMUSCULAR | Status: DC
  Administered 2021-04-10: 3 [IU] via SUBCUTANEOUS

## 2021-04-10 MED ORDER — LEVOTHYROXINE SODIUM 75 MCG PO TABS
75.0000 ug | ORAL_TABLET | Freq: Every day | ORAL | Status: DC
  Administered 2021-04-11: 75 ug via ORAL
  Filled 2021-04-10 (×3): qty 1

## 2021-04-10 MED ORDER — PROPOFOL 10 MG/ML IV BOLUS
INTRAVENOUS | Status: AC
  Filled 2021-04-10: qty 20

## 2021-04-10 MED ORDER — FENTANYL CITRATE (PF) 100 MCG/2ML IJ SOLN
INTRAMUSCULAR | Status: DC | PRN
  Administered 2021-04-10: 100 ug via INTRAVENOUS

## 2021-04-10 MED ORDER — OXYCODONE HCL 5 MG PO TABS
5.0000 mg | ORAL_TABLET | ORAL | Status: DC | PRN
  Administered 2021-04-10: 5 mg via ORAL

## 2021-04-10 MED ORDER — ACETAMINOPHEN 500 MG PO TABS
ORAL_TABLET | ORAL | Status: AC
  Filled 2021-04-10: qty 2

## 2021-04-10 MED ORDER — ENOXAPARIN SODIUM 40 MG/0.4ML IJ SOSY
40.0000 mg | PREFILLED_SYRINGE | Freq: Once | INTRAMUSCULAR | Status: AC
  Administered 2021-04-10: 40 mg via SUBCUTANEOUS

## 2021-04-10 MED ORDER — ROCURONIUM BROMIDE 10 MG/ML (PF) SYRINGE
PREFILLED_SYRINGE | INTRAVENOUS | Status: AC
  Filled 2021-04-10: qty 10

## 2021-04-10 MED ORDER — ENOXAPARIN SODIUM 40 MG/0.4ML IJ SOSY: 40.0000 mg | PREFILLED_SYRINGE | INTRAMUSCULAR | Status: DC

## 2021-04-10 MED ORDER — EPHEDRINE SULFATE 50 MG/ML IJ SOLN
INTRAMUSCULAR | Status: DC | PRN
  Administered 2021-04-10 (×2): 20 mg via INTRAVENOUS
  Administered 2021-04-10: 10 mg via INTRAVENOUS

## 2021-04-10 MED ORDER — ROCURONIUM BROMIDE 100 MG/10ML IV SOLN
INTRAVENOUS | Status: DC | PRN
  Administered 2021-04-10: 60 mg via INTRAVENOUS

## 2021-04-10 MED ORDER — PROPOFOL 10 MG/ML IV BOLUS
INTRAVENOUS | Status: DC | PRN
  Administered 2021-04-10: 100 mg via INTRAVENOUS

## 2021-04-10 MED ORDER — OXYCODONE HCL 5 MG PO TABS: 10.0000 mg | ORAL_TABLET | ORAL | Status: DC | PRN

## 2021-04-10 MED ORDER — BUPIVACAINE LIPOSOME 1.3 % IJ SUSP: 20.0000 mL | Freq: Once | INTRAMUSCULAR | Status: DC

## 2021-04-10 MED ORDER — INSULIN ASPART 100 UNIT/ML IJ SOLN: 0.0000 [IU] | Freq: Three times a day (TID) | INTRAMUSCULAR | Status: DC

## 2021-04-10 MED ORDER — CEFAZOLIN SODIUM-DEXTROSE 2-4 GM/100ML-% IV SOLN
INTRAVENOUS | Status: AC
  Filled 2021-04-10: qty 100

## 2021-04-10 MED ORDER — ACETAMINOPHEN 325 MG PO TABS
650.0000 mg | ORAL_TABLET | Freq: Four times a day (QID) | ORAL | Status: DC
  Administered 2021-04-10 – 2021-04-11 (×3): 650 mg via ORAL

## 2021-04-10 MED ORDER — OXYCODONE HCL 5 MG/5ML PO SOLN: 5.0000 mg | Freq: Once | ORAL | Status: DC | PRN

## 2021-04-10 MED ORDER — IMIPRAMINE HCL 50 MG PO TABS
50.0000 mg | ORAL_TABLET | Freq: Every day | ORAL | Status: DC
  Administered 2021-04-10: 50 mg via ORAL
  Filled 2021-04-10: qty 1

## 2021-04-10 MED ORDER — PROCHLORPERAZINE EDISYLATE 10 MG/2ML IJ SOLN
10.0000 mg | INTRAMUSCULAR | Status: DC | PRN
  Filled 2021-04-10: qty 2

## 2021-04-10 MED ORDER — ONDANSETRON HCL 4 MG/2ML IJ SOLN
INTRAMUSCULAR | Status: DC | PRN
  Administered 2021-04-10: 4 mg via INTRAVENOUS

## 2021-04-10 MED ORDER — 0.9 % SODIUM CHLORIDE (POUR BTL) OPTIME
TOPICAL | Status: DC | PRN
  Administered 2021-04-10: 500 mL

## 2021-04-10 MED ORDER — LIDOCAINE HCL (CARDIAC) PF 100 MG/5ML IV SOSY
PREFILLED_SYRINGE | INTRAVENOUS | Status: DC | PRN
  Administered 2021-04-10: 60 mg via INTRAVENOUS

## 2021-04-10 MED ORDER — ONDANSETRON HCL 4 MG/2ML IJ SOLN
INTRAMUSCULAR | Status: AC
  Filled 2021-04-10: qty 2

## 2021-04-10 MED ORDER — OXYCODONE HCL 5 MG PO TABS
ORAL_TABLET | ORAL | Status: AC
  Filled 2021-04-10: qty 1

## 2021-04-10 MED ORDER — LACTATED RINGERS IV SOLN: INTRAVENOUS | Status: DC

## 2021-04-10 MED ORDER — ASPIRIN EC 81 MG PO TBEC
81.0000 mg | DELAYED_RELEASE_TABLET | Freq: Every day | ORAL | Status: DC
  Administered 2021-04-10: 81 mg via ORAL
  Filled 2021-04-10: qty 1

## 2021-04-10 SURGICAL SUPPLY — 42 items
ADH SKN CLS APL DERMABOND .7 (GAUZE/BANDAGES/DRESSINGS) ×1
APL PRP STRL LF DISP 70% ISPRP (MISCELLANEOUS) ×1
BLADE CLIPPER SENSICLIP SURGIC (BLADE) IMPLANT
CABLE HIGH FREQUENCY MONO STRZ (ELECTRODE) ×2 IMPLANT
CHLORAPREP W/TINT 26 (MISCELLANEOUS) ×2 IMPLANT
DECANTER SPIKE VIAL GLASS SM (MISCELLANEOUS) IMPLANT
DERMABOND ADVANCED (GAUZE/BANDAGES/DRESSINGS) ×1
DERMABOND ADVANCED .7 DNX12 (GAUZE/BANDAGES/DRESSINGS) ×1 IMPLANT
ELECT REM PT RETURN 9FT ADLT (ELECTROSURGICAL) ×2
ELECTRODE REM PT RTRN 9FT ADLT (ELECTROSURGICAL) ×1 IMPLANT
GAUZE 4X4 16PLY ~~LOC~~+RFID DBL (SPONGE) ×2 IMPLANT
GLOVE SRG 8 PF TXTR STRL LF DI (GLOVE) ×1 IMPLANT
GLOVE SURG ENC MOIS LTX SZ7.5 (GLOVE) ×2 IMPLANT
GLOVE SURG UNDER POLY LF SZ8 (GLOVE) ×2
GOWN STRL REUS W/ TWL XL LVL3 (GOWN DISPOSABLE) ×1 IMPLANT
GOWN STRL REUS W/TWL XL LVL3 (GOWN DISPOSABLE) ×2
GRASPER SUT TROCAR 14GX15 (MISCELLANEOUS) ×2 IMPLANT
IRRIG SUCT STRYKERFLOW 2 WTIP (MISCELLANEOUS)
IRRIGATION SUCT STRKRFLW 2 WTP (MISCELLANEOUS) IMPLANT
KIT TURNOVER CYSTO (KITS) ×2 IMPLANT
MESH 3DMAX 4X6 LT LRG (Mesh General) ×1 IMPLANT
MESH 3DMAX 4X6 RT LRG (Mesh General) ×1 IMPLANT
NEEDLE INSUFFLATION 120MM (ENDOMECHANICALS) ×2 IMPLANT
NS IRRIG 500ML POUR BTL (IV SOLUTION) ×1 IMPLANT
PACK BASIN DAY SURGERY FS (CUSTOM PROCEDURE TRAY) ×2 IMPLANT
PAD POSITIONING PINK XL (MISCELLANEOUS) ×2 IMPLANT
RELOAD STAPLE 4.0 BLU F/HERNIA (INSTRUMENTS) IMPLANT
RELOAD STAPLE 4.8 BLK F/HERNIA (STAPLE) IMPLANT
RELOAD STAPLE HERNIA 4.0 BLUE (INSTRUMENTS) ×2 IMPLANT
RELOAD STAPLE HERNIA 4.8 BLK (STAPLE) ×6 IMPLANT
SCISSORS LAP 5X35 DISP (ENDOMECHANICALS) ×2 IMPLANT
SET TUBE SMOKE EVAC HIGH FLOW (TUBING) ×2 IMPLANT
SPONGE T-LAP 18X18 ~~LOC~~+RFID (SPONGE) IMPLANT
STAPLER HERNIA 12 8.5 360D (INSTRUMENTS) IMPLANT
SUT MNCRL AB 4-0 PS2 18 (SUTURE) ×2 IMPLANT
SUT VICRYL 0 UR6 27IN ABS (SUTURE) IMPLANT
TOWEL OR 17X26 10 PK STRL BLUE (TOWEL DISPOSABLE) ×2 IMPLANT
TRAY FOL W/BAG SLVR 16FR STRL (SET/KITS/TRAYS/PACK) IMPLANT
TRAY FOLEY W/BAG SLVR 16FR LF (SET/KITS/TRAYS/PACK)
TRAY LAPAROSCOPIC (CUSTOM PROCEDURE TRAY) ×2 IMPLANT
TROCAR BLADELESS OPT 12M 100M (ENDOMECHANICALS) ×2 IMPLANT
TROCAR BLADELESS OPT 5 100 (ENDOMECHANICALS) ×4 IMPLANT

## 2021-04-10 NOTE — Anesthesia Postprocedure Evaluation (Signed)
Anesthesia Post Note  Patient: Sandra Harvey  Procedure(s) Performed: LAPAROSCOPIC BILATERAL INGUINAL HERNIA REPAIR WITH MESH (Bilateral: Abdomen)     Patient location during evaluation: PACU Anesthesia Type: General Level of consciousness: awake and alert and oriented Pain management: pain level controlled Vital Signs Assessment: post-procedure vital signs reviewed and stable Respiratory status: spontaneous breathing, nonlabored ventilation and respiratory function stable Cardiovascular status: blood pressure returned to baseline and stable Postop Assessment: no apparent nausea or vomiting Anesthetic complications: no   No notable events documented.  Last Vitals:  Vitals:   04/10/21 1539 04/10/21 1545  BP: 139/63 (!) 151/71  Pulse: 67 68  Resp: 14 20  Temp:    SpO2: 97% 100%    Last Pain:  Vitals:   04/10/21 1539  TempSrc:   PainSc: 0-No pain                 Tymara Saur A.

## 2021-04-10 NOTE — Anesthesia Preprocedure Evaluation (Addendum)
Anesthesia Evaluation  Patient identified by MRN, date of birth, ID band Patient awake    Reviewed: Allergy & Precautions, NPO status , Patient's Chart, lab work & pertinent test results, reviewed documented beta blocker date and time   History of Anesthesia Complications (+) PROLONGED EMERGENCE and history of anesthetic complications  Airway Mallampati: I  TM Distance: >3 FB Neck ROM: Full    Dental no notable dental hx. (+) Teeth Intact, Caps, Dental Advisory Given   Pulmonary former smoker,    Pulmonary exam normal breath sounds clear to auscultation       Cardiovascular hypertension, Pt. on medications Normal cardiovascular exam Rhythm:Regular Rate:Normal     Neuro/Psych  Headaches, PSYCHIATRIC DISORDERS Anxiety Depression  Neuromuscular disease    GI/Hepatic PUD, GERD  Medicated and Controlled,  Endo/Other  diabetes, Well Controlled, Type 2, Oral Hypoglycemic AgentsHypothyroidism Hyperlipidemia  Renal/GU   negative genitourinary   Musculoskeletal  (+) Arthritis , Osteoarthritis,  Bilateral inguinal hernias   Abdominal   Peds  Hematology negative hematology ROS (+)   Anesthesia Other Findings   Reproductive/Obstetrics                             Anesthesia Physical Anesthesia Plan  ASA: 2  Anesthesia Plan: General   Post-op Pain Management:    Induction: Intravenous  PONV Risk Score and Plan: 4 or greater and Treatment may vary due to age or medical condition, Ondansetron and Dexamethasone  Airway Management Planned: Oral ETT  Additional Equipment:   Intra-op Plan:   Post-operative Plan: Extubation in OR  Informed Consent: I have reviewed the patients History and Physical, chart, labs and discussed the procedure including the risks, benefits and alternatives for the proposed anesthesia with the patient or authorized representative who has indicated his/her understanding  and acceptance.     Dental advisory given  Plan Discussed with: CRNA and Anesthesiologist  Anesthesia Plan Comments:        Anesthesia Quick Evaluation

## 2021-04-10 NOTE — H&P (Signed)
Admitting Physician: Hyman Hopes Yasuo Phimmasone  Service: General surgery  CC: hernia  Subjective   HPI: Sandra Harvey is an 80 y.o. female who is here for elective hernia repair.  Past Medical History:  Diagnosis Date   Allergy    Anxiety    Arthritis    oa   Bilateral inguinal hernia 04/06/2021   Cataract    Complication of anesthesia    slow to wake up  after 2019 colonscopy at dr hung office   Depression    Dry eye syndrome    both eyes   Fuchs' corneal dystrophy    GERD (gastroesophageal reflux disease)    GI bleed 2021   Headache    History of migraine headaches    none recne per pt on 04-06-2021   History of osteopenia    Hypercholesterolemia    Hyperlipidemia    Hypertension    off bp meds 6 months due to low bp   Hypothyroidism    Inversion, nipple    right   Stomach ulcer    diagnosed 08/2017    Type 2 DM    does not check cbg, last hemaglobin 04-04-2021 was 5.8 on 04-04-2021 at dr pharr office    Past Surgical History:  Procedure Laterality Date   belly button surgery  2019   dr Ezzard Standing   bilteral thumb replacement     both yrs ago per pt on 04-07-2021   BIOPSY  10/16/2019   Procedure: BIOPSY;  Surgeon: Jeani Hawking, MD;  Location: [facility];  Service: Endoscopy;;   COLONOSCOPY WITH PROPOFOL N/A 10/16/2019   Procedure: COLONOSCOPY WITH PROPOFOL;  Surgeon: Jeani Hawking, MD;  Location: [facility];  Service: Endoscopy;  Laterality: N/A;   CORNEAL TRANSPLANT Bilateral    2007, 2010   DILATION AND CURETTAGE OF UTERUS     yrs ago per pt on 04-06-2021   ENTEROSCOPY N/A 10/16/2019   Procedure: ENTEROSCOPY;  Surgeon: Jeani Hawking, MD;  Location: [facility];  Service: Endoscopy;  Laterality: N/A;   EYE SURGERY Bilateral    both eyes cataracts yrs ago per pt on 04-07-2021   HAND SURGERY  02/2011   left carpal tunnel release  Left 2019   at [facility]   right carpal tunnel release     yrs ago per pt on 04-07-2021    right foot morton's neuroma surgery   1990   1980's and 1990's   right tennis elbow surgery     1990's   THYROIDECTOMY, PARTIAL  02/1971   center part removed per pt   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 Her smoking use included cigarettes. She has a 30.00 pack-year smoking history. She has never used smokeless tobacco. She reports current alcohol use of about 1.0 standard drink per week. She reports that she does not use drugs.  Allergies:  Allergies  Allergen Reactions   Ibuprofen Nausea Only   Meloxicam  Caused ulcer    Nsaids Nausea And Vomiting    With prolonged use    Medications: Current Outpatient Medications  Medication Instructions   acetaminophen (TYLENOL) 1,000 mg, Oral, Every 6 hours PRN   alum & mag hydroxide-simeth (MAALOX/MYLANTA) 200-200-20 MG/5ML suspension 5 mLs, Oral, Every 6 hours PRN   Aspirin (ECOTRIN PO) 81 mg, Oral, Daily after supper   cetirizine (ZYRTEC) 10 mg, Oral, Daily PRN   denosumab (PROLIA) 60 mg, Subcutaneous, Every 6 months, Scheduled 10/20/2019   hydrocortisone cream 1 % 1 application, Topical, Daily PRN   imipramine (TOFRANIL) 50 mg, Oral, Daily at bedtime   levothyroxine (SYNTHROID) 75 mcg, Oral, Daily before breakfast, Takes 1/2 tab of 75 mcg on Sunday only and 75 mcg every other day   metFORMIN (GLUCOPHAGE-XR) 500 mg, Oral, 2 times daily after meals   rosuvastatin (CRESTOR) 20 mg, Oral, Daily after supper   sertraline (ZOLOFT) 100 mg, Oral, Daily after breakfast   telmisartan  (MICARDIS) 20 mg, Oral, Daily   UNABLE TO FIND Med Name vitamin b 12  2, 500 mcg sl daily   VITAMIN D PO 100 mg, Oral, 2 times daily    ROS - all of the below systems have been reviewed with the patient and positives are indicated with bold text General: chills, fever or night sweats Eyes: blurry vision or double vision ENT: epistaxis or sore throat Allergy/Immunology: itchy/watery eyes or nasal congestion Hematologic/Lymphatic: bleeding problems, blood clots or swollen lymph nodes Endocrine: temperature intolerance or unexpected weight changes Breast: new or changing breast lumps or nipple discharge Resp: cough, shortness of breath, or wheezing CV: chest pain or dyspnea on exertion GI: as per HPI GU: dysuria, trouble voiding, or hematuria MSK: joint pain or joint stiffness Neuro: TIA or stroke symptoms Derm: pruritus and skin lesion changes Psych: anxiety and depression  Objective   PE Blood pressure (!) 158/74, pulse 77, temperature 98.1 F (36.7 C), temperature source Oral, resp. rate 15, height 4\' 11"  (1.499 m), weight 62.9 kg, SpO2 96 %. Constitutional: NAD; conversant; no deformities Eyes: Moist conjunctiva; no lid lag; anicteric; PERRL Neck: Trachea midline; no thyromegaly Lungs: Normal respiratory effort; no tactile fremitus CV: RRR; no palpable thrills; no pitting edema GI: Abd Soft, non-tender, bilateral inguinal hernias; no palpable hepatosplenomegaly MSK: Normal range of motion of extremities; no clubbing/cyanosis Psychiatric: Appropriate affect; alert and oriented x3 Lymphatic: No palpable cervical or axillary lymphadenopathy  Results for orders placed or performed during the hospital encounter of 04/10/21 (from the past 24 hour(s))  I-STAT, chem 8     Status: Abnormal   Collection Time: 04/10/21  1:09 PM  Result Value Ref Range   Sodium 141 135 - 145 mmol/L   Potassium 4.0 3.5 - 5.1 mmol/L   Chloride 104 98 - 111 mmol/L   BUN 14 8 - 23 mg/dL   Creatinine,  Ser 1.10 (H) 0.44 - 1.00 mg/dL   Glucose, Bld 87 70 - 99 mg/dL   Calcium, Ion 1.17 1.15 - 1.40 mmol/L   TCO2 26 22 - 32 mmol/L   Hemoglobin 13.3 12.0 - 15.0 g/dL   HCT 39.0 36.0 - 46.0 %    Imaging Orders  No imaging studies ordered today   CT a/p 10/28/20 BILATERAL inguinal hernias containing fat.   No acute intra-abdominal or intrapelvic abnormalities.   Aortic Atherosclerosis (ICD10-I70.0).  Assessment and Plan   Diagnoses and all orders for this visit:  Non-recurrent bilateral inguinal hernia without obstruction or gangrene    I  explained the pathophysiology of her inguinal hernias and the options for surgical repair versus observation. At first she didn't want surgery as her symptoms were not severe. She returned to office with worsening symptoms interested in surgery. I recommended laparoscopic bilateral inguinal hernia repair with mesh. The procedure itself as well as its risks, benefits and alternatives were discussed. She would like to stay in the hospital overnight as she has nobody to stay at home with her. Our surgery scheduler will reach out to the patient to schedule surgery.  Return for post op.      Felicie Morn, MD  [facility] Use [email or web address] to contact on call provider

## 2021-04-10 NOTE — Transfer of Care (Signed)
Immediate Anesthesia Transfer of Care Note  Patient: Sandra Harvey  Procedure(s) Performed: Procedure(s) (LRB): LAPAROSCOPIC BILATERAL INGUINAL HERNIA REPAIR WITH MESH (Bilateral)  Patient Location: PACU  Anesthesia Type: General  Level of Consciousness: awake, sedated, patient cooperative and responds to stimulation  Airway & Oxygen Therapy: Patient Spontanous Breathing and Patient connected to [place] 02 and soft FM   Post-op Assessment: Report given to PACU RN, Post -op Vital signs reviewed and stable and Patient moving all extremities  Post vital signs: Reviewed and stable  Complications: No apparent anesthesia complications

## 2021-04-10 NOTE — Op Note (Signed)
Patient: Sandra Harvey (Feb 13, 1941, [id number])  Date of Surgery: 04/10/2021   Preoperative Diagnosis: BILATERAL INGUINAL HERNIA   Postoperative Diagnosis: BILATERAL INGUINAL HERNIA   Surgical Procedure: LAPAROSCOPIC BILATERAL INGUINAL HERNIA REPAIR WITH MESH:    Operative Team Members:  Surgeon(s) and Role:    * Disaya Walt, Hyman Hopes, MD - Primary   Anesthesiologist: Mal Amabile, MD CRNA: Jessica Priest, CRNA   Anesthesia: General   Fluids:  Total I/O In: -  Out: 50 [Blood:50]  Complications: None  Drains:  None  Specimen: None  Disposition:  PACU - hemodynamically stable.  Plan of Care: Admit for overnight observation  Indications for Procedure: Sandra Harvey is a 80 y.o. female who presented with bilateral inguinal hernias.  I recommended laparoscopic repair.  The procedure itself as well as its risks, benefits and alternatives were discussed and the patient granted consent to proceed.  Findings:  Technique: Transabdominal preperitoneal (TAPP) Hernia Location: Bilateral indirect inguinal hernias containing Mesh Size &Type:  Bard 3D Max Left and Right Large mesh Mesh Fixation: Endo-Universal hernia stapler  Infection status: Patient: Private Patient Elective Case Case: Elective Infection Present At Time Of Surgery (PATOS): None  Description of Procedure:  The patient was positioned supine, padded and secured to the bed, with both arms tucked.  The abdomen was widely prepped and draped.  A time out procedure was performed.  A 1 cm infraumbilical incision was made.  The abdomen was entered utilizing a Veress needle at the umbilical stalk, using a kocher clamp to elevate the umbilical stalk.  The abdomen was insufflated to 15 mm of Hg.  A blunt 12 mm trocar was inserted using the optical techinque.  Additional 5 mm trocars were placed in the left and right abdomen.  There was an Indirect hernia on the RIGHT.  Utilizing a transabdominal pre  peritoneal technique (TAPP), a horizontal incision was made in the peritoneum, immediately below the umbilicus.  Dissection was carried out in the pre peritoneal space down to the level of the hernia sac which was reduced into the peritoneal cavity completely.  The round ligament was identified and divided utilizing cautery.  A large pre peritoneal dissection was performed to uncover the direct, indirect, femoral and obturator spaces.  Cooper's ligament was uncovered medially and the psoas muscle uncovered laterally.  The mesh, as documented above, was opened and advanced into the pre peritoneal position so that it more than adequately covered the indirect, direct, femoral and obturator spaces.  The mesh laid flat, with no inferior folds and covered the entire myopectineal orifice.  The mesh was fixated with the endo-universal hernia stapler to Cooper's ligament and the posterior aspect of the rectus muscle.  The peritoneal flap was closed with the same device.  There were no peritoneal defects or exposed mesh at the conclusion.  There was an indirect hernia on the LEFT.  Utilizing a transabdominal pre peritoneal technique (TAPP), a horizontal incision was made in the peritoneum, immediately below the umbilicus.  Dissection was carried out in the pre peritoneal space down to the level of the hernia sac which was reduced into the peritoneal cavity completely.  The round ligament was identified and divided utilizing cautery.  A large pre peritoneal dissection was performed to uncover the direct, indirect, femoral and obturator spaces.  Cooper's ligament was uncovered medially and the psoas muscle uncovered laterally.  The mesh, as documented above, was opened and advanced into the pre peritoneal position so that it more than adequately covered  the indirect, direct, femoral and obturator spaces.  The mesh laid flat, with no inferior folds and covered the entire myopectineal orifice.  The mesh was fixated with the  endo-universal hernia stapler to Cooper's ligament and the posterior aspect of the rectus muscle.  The peritoneal flap was closed with the same device.  There were no peritoneal defects or exposed mesh at the conclusion.  The umbilical trocar was removed and the fascial defect was closed with a figure of eight 0 Vicryl suture, utilizing a suture passer.  The peritoneal cavity was completely desufflated, the trocars removed and the skin closed with 4-0 Monocryl subcuticular suture and skin glue.  All sponge and needle counts were correct at the end of the case.  Ivar Drape, MD General, Bariatric, & Minimally Invasive Surgery [facility], [place]

## 2021-04-10 NOTE — Discharge Instructions (Addendum)
 GROIN HERNIA REPAIR POST OPERATIVE INSTRUCTIONS  Thinking Clearly  The anesthesia may cause you to feel different for 1 or 2 days. Do not drive, drink alcohol, or make any big decisions for at least 2 days.  Nutrition When you wake up, you will be able to drink small amounts of liquid. If you do not feel sick, you can slowly advance your diet to regular foods. Continue to drink lots of fluids, usually about 8 to 10 glasses per day. Eat a high-fiber diet so you don't strain during bowel movements. High-Fiber Foods Foods high in fiber include beans, bran cereals and whole-grain breads, peas, dried fruit (figs, apricots, and dates), raspberries, blackberries, strawberries, sweet corn, broccoli, baked potatoes with skin, plums, pears, apples, greens, and nuts. Activity Slowly increase your activity. Be sure to get up and walk every hour or so to prevent blood clots. No heavy lifting or strenuous activity for 4 weeks following surgery to prevent hernias at your incision sites or recurrence of your hernia. It is normal to feel tired. You may need more sleep than usual.  Get your rest but make sure to get up and move around frequently to prevent blood clots and pneumonia.  Work and Return to School You can go back to work when you feel well enough. Discuss the timing with your surgeon. You can usually go back to school or work 1 week or less after an laparoscopic or an open repair. If your work requires heavy lifting or strenuous activity you need to be placed on light duty for 4 weeks following surgery. You can return to gym class, sports or other physical activities 4 weeks after surgery.  Wound Care You may experience significant bruising in the groin including into the scrotum in males.  Rest, elevating the groin and scrotum above the level of the heart, ice and compression with tight fitting underwear can help.  Always wash your hands before and after touching near your incision site. Do  not soak in a bathtub until cleared at your follow up appointment. You may take a shower 24 hours after surgery. A small amount of drainage from the incision is normal. If the drainage is thick and yellow or the site is red, you may have an infection, so call your surgeon. If you have a drain in one of your incisions, it will be taken out in office when the drainage stops. Steri-Strips will fall off in 7 to 10 days or they will be removed during your first office visit. If you have dermabond glue covering over the incision, allow the glue to flake off on its own. Protect the new skin, especially from the sun. The sun can burn and cause darker scarring. Your scar will heal in about 4 to 6 weeks and will become softer and continue to fade over the next year.  The cosmetic appearance of the incisions will improve over the course of the first year after surgery. Sensation around your incision will return in a few weeks or months.  Bowel Movements After intestinal surgery, you may have loose watery stools for several days. If watery diarrhea lasts longer than 3 days, contact your surgeon. Pain medication (narcotics) can cause constipation. Increase the fiber in your diet with high-fiber foods if you are constipated. You can take an over the counter stool softener like Colace to avoid constipation.  Additional over the counter medications can also be used if Colace isn't sufficient (for example, Milk of Magnesia or Miralax).    Pain The amount of pain is different for each person. Some people need only 1 to 3 doses of pain control medication, while others need more. Take alternating doses of tylenol and ibuprofen around the clock for the first five days following surgery.  This will provide a baseline of pain control and help with inflammation.  Take the narcotic pain medication in addition if needed for severe pain.  Contact Your Surgeon at [phone], if you have: Pain that will not go away Pain that  gets worse A fever of more than 101F (38.3C) Repeated vomiting Swelling, redness, bleeding, or bad-smelling drainage from your wound site Strong abdominal pain No bowel movement or unable to pass gas for 3 days Watery diarrhea lasting longer than 3 days  Pain Control The goal of pain control is to minimize pain, keep you moving and help you heal. Your surgical team will work with you on your pain plan. Most often a combination of therapies and medications are used to control your pain. You may also be given medication (local anesthetic) at the surgical site. This may help control your pain for several days. Extreme pain puts extra stress on your body at a time when your body needs to focus on healing. Do not wait until your pain has reached a level "10" or is unbearable before telling your doctor or nurse. It is much easier to control pain before it becomes severe. Following a laparoscopic procedure, pain is sometimes felt in the shoulder. This is due to the gas inserted into your abdomen during the procedure. Moving and walking helps to decrease the gas and the right shoulder pain.  Use the guide below for ways to manage your post-operative pain. Learn more by going to facs.org/safepaincontrol.  How Intense Is My Pain Common Therapies to Feel Better       I hardly notice my pain, and it does not interfere with my activities.  I notice my pain and it distracts me, but I can still do activities (sitting up, walking, standing).  Non-Medication Therapies  Ice (in a bag, applied over clothing at the surgical site), elevation, rest, meditation, massage, distraction (music, TV, play) walking and mild exercise Splinting the abdomen with pillows +  Non-Opioid Medications Acetaminophen (Tylenol) Non-steroidal anti-inflammatory drugs (NSAIDS) Aspirin, Ibuprofen (Motrin, Advil) Naproxen (Aleve) Take these as needed, when you feel pain. Both acetaminophen and NSAIDs help to decrease pain  and swelling (inflammation).      My pain is hard to ignore and is more noticeable even when I rest.  My pain interferes with my usual activities.  Non-Medication Therapies  +  Non-Opioid medications  Take on a regular schedule (around-the-clock) instead of as needed. (For example, Tylenol every 6 hours at 9:00 am, 3:00 pm, 9:00 pm, 3:00 am and Motrin every 6 hours at 12:00 am, 6:00 am, 12:00 pm, 6:00 pm)         I am focused on my pain, and I am not doing my daily activities.  I am groaning in pain, and I cannot sleep. I am unable to do anything.  My pain is as bad as it could be, and nothing else matters.  Non-Medication Therapies  +  Around-the-Clock Non-Opioid Medications  +  Short-acting opioids  Opioids should be used with other medications to manage severe pain. Opioids block pain and give a feeling of euphoria (feel high). Addiction, a serious side effect of opioids, is rare with short-term (a few days) use.  Examples of short-acting opioids   include: Tramadol (Ultram), Hydrocodone (Norco, Vicodin), Hydromorphone (Dilaudid), Oxycodone (Oxycontin)     The above directions have been adapted from the [employer] Surgical Patient Education Program.  Please refer to the ACS website if needed: http://chapman.info/.ashx   Ivar Drape, MD [facility] [address], [place], [place]  [address] ?  [address] [address], [place], [place]   [address] [phone] ? [phone] ? FAX [phone] Web site: [email or web address]  Information for Discharge Teaching: EXPAREL (bupivacaine liposome injectable suspension)   Your surgeon or anesthesiologist gave you EXPAREL(bupivacaine) to help control your pain after surgery.  EXPAREL is a local anesthetic that provides pain relief by numbing the tissue around the surgical site. EXPAREL is designed to release  pain medication over time and can control pain for up to 72 hours. Depending on how you respond to EXPAREL, you may require less pain medication during your recovery.  Possible side effects: Temporary loss of sensation or ability to move in the area where bupivacaine was injected. Nausea, vomiting, constipation Rarely, numbness and tingling in your mouth or lips, lightheadedness, or anxiety may occur. Call your doctor right away if you think you may be experiencing any of these sensations, or if you have other questions regarding possible side effects.  Follow all other discharge instructions given to you by your surgeon or nurse. Eat a healthy diet and drink plenty of water or other fluids.  If you return to the hospital for any reason within 96 hours following the administration of EXPAREL, it is important for health care providers to know that you have received this anesthetic. A teal colored band has been placed on your arm with the date, time and amount of EXPAREL you have received in order to alert and inform your health care providers. Please leave this armband in place for the full 96 hours following administration, and then you may remove the band.

## 2021-04-10 NOTE — Anesthesia Procedure Notes (Signed)
Procedure Name: Intubation Date/Time: 04/10/2021 2:09 PM Performed by: Justice Rocher, CRNA Pre-anesthesia Checklist: Patient identified, Emergency Drugs available, Suction available, Patient being monitored and Timeout performed Patient Re-evaluated:Patient Re-evaluated prior to induction Oxygen Delivery Method: Circle system utilized Preoxygenation: Pre-oxygenation with 100% oxygen Induction Type: IV induction Ventilation: Mask ventilation without difficulty Laryngoscope Size: Mac and 3 Grade View: Grade II Tube type: Oral Tube size: 7.0 mm Number of attempts: 1 Airway Equipment and Method: Stylet and Oral airway Placement Confirmation: ETT inserted through vocal cords under direct vision, positive ETCO2, breath sounds checked- equal and bilateral and CO2 detector Secured at: 21 cm Tube secured with: Tape Dental Injury: Teeth and Oropharynx as per pre-operative assessment  Comments: Paper tape used

## 2021-04-11 ENCOUNTER — Encounter (HOSPITAL_BASED_OUTPATIENT_CLINIC_OR_DEPARTMENT_OTHER): Payer: Self-pay | Admitting: Surgery

## 2021-04-11 DIAGNOSIS — K4020 Bilateral inguinal hernia, without obstruction or gangrene, not specified as recurrent: Secondary | ICD-10-CM | POA: Diagnosis not present

## 2021-04-11 LAB — GLUCOSE, CAPILLARY: Glucose-Capillary: 114 mg/dL — ABNORMAL HIGH (ref 70–99)

## 2021-04-11 MED ORDER — OXYCODONE-ACETAMINOPHEN 5-325 MG PO TABS: 1.0000 | ORAL_TABLET | ORAL | 0 refills | Status: DC | PRN

## 2021-04-11 MED ORDER — ACETAMINOPHEN 325 MG PO TABS
ORAL_TABLET | ORAL | Status: AC
  Filled 2021-04-11: qty 2

## 2021-04-11 NOTE — Discharge Summary (Signed)
Patient ID: Sandra Harvey [id number] 80 y.o. 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   Osteoarthritis of hip 08/23/2017   Acute pain of left wrist 06/12/2017   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   Pseudophakia 07/23/2012   Diabetes mellitus (HCC) 01/23/2012   Cardiovascular disease 10/08/2011   GERD (gastroesophageal reflux disease) 10/08/2011   Hypothyroidism 10/08/2011   After-cataract 07/11/2011   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     Discharge Diagnoses: Bilateral inguinal hernias Patient Active Problem List   Diagnosis Date Noted   Arthritis 03/23/2019   Atrophic vaginitis 03/23/2019   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   Menopausal symptom 03/23/2019   Migraine 03/23/2019   Onychomycosis 03/23/2019   Postmenopausal bleeding 03/23/2019   Pain in right knee 10/28/2017   Overweight (BMI 25.0-29.9) 09/18/2017   S/P right THA, 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   Osteoarthritis of hip 08/23/2017   Acute pain of left wrist 06/12/2017   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  She recovered well and was discharged the following day.  Consults: None  Significant Diagnostic Studies: none  Treatments: surgery: as above  Disposition: Home  Patient Instructions:  Allergies as of 04/11/2021       Reactions   Ibuprofen Nausea Only   Meloxicam    Caused ulcer    Nsaids Nausea And Vomiting   With prolonged use        Medication List     TAKE these medications    acetaminophen 500 MG tablet Commonly known as: TYLENOL Take 1,000 mg by mouth every 6 (six) hours as needed for moderate pain.   alum & mag hydroxide-simeth 200-200-20 MG/5ML suspension Commonly known as: MAALOX/MYLANTA Take 5 mLs by mouth every 6 (six) hours as needed for indigestion or heartburn.   cetirizine 10 MG tablet Commonly known as: ZYRTEC Take 10 mg by mouth daily as needed for allergies.   denosumab 60 MG/ML Sosy injection Commonly known as: PROLIA Inject 60 mg into the skin every 6 (six) months. Scheduled 10/20/2019   ECOTRIN PO Take 81 mg by mouth daily after supper.   hydrocortisone cream 1 % Apply 1 application topically daily as needed for itching.   imipramine 50 MG tablet Commonly known as: TOFRANIL Take 50 mg by mouth at bedtime.   levothyroxine 75 MCG tablet Commonly known as: SYNTHROID Take 75 mcg by mouth daily before breakfast. Takes 1/2 tab of 75 mcg on Sunday only and 75 mcg every other day  metFORMIN 500 MG 24 hr tablet Commonly known as: GLUCOPHAGE-XR Take 500 mg by mouth 2 (two) times daily after a meal.   oxyCODONE-acetaminophen 5-325 MG tablet Commonly known as: Percocet Take 1 tablet by mouth every 4 (four) hours as needed for severe  pain.   rosuvastatin 20 MG tablet Commonly known as: CRESTOR Take 20 mg by mouth daily after supper.   sertraline 100 MG tablet Commonly known as: ZOLOFT Take 100 mg by mouth daily after breakfast.   telmisartan 20 MG tablet Commonly known as: MICARDIS Take 20 mg by mouth daily.   UNABLE TO FIND Med Name vitamin b 12  2, 500 mcg sl daily   VITAMIN D PO Take 100 mg by mouth 2 (two) times daily.        Activity: no heavy lifting for 4 weeks Diet: regular diet Wound Care: keep wound clean and dry  Follow-up:  With Dr. Dossie Der in 4 weeks.  Signed: Hyman Hopes Mamoudou Harvey General, Bariatric, & Minimally Invasive Surgery [facility], [place]   04/11/2021, 7:34 AM

## 2021-05-01 ENCOUNTER — Encounter: Payer: Self-pay | Admitting: Podiatry

## 2021-05-01 ENCOUNTER — Ambulatory Visit (INDEPENDENT_AMBULATORY_CARE_PROVIDER_SITE_OTHER): Payer: Medicare Other | Admitting: Podiatry

## 2021-05-01 DIAGNOSIS — E559 Vitamin D deficiency, unspecified: Secondary | ICD-10-CM | POA: Insufficient documentation

## 2021-05-01 DIAGNOSIS — M810 Age-related osteoporosis without current pathological fracture: Secondary | ICD-10-CM | POA: Insufficient documentation

## 2021-05-01 DIAGNOSIS — F339 Major depressive disorder, recurrent, unspecified: Secondary | ICD-10-CM | POA: Insufficient documentation

## 2021-05-01 DIAGNOSIS — E1151 Type 2 diabetes mellitus with diabetic peripheral angiopathy without gangrene: Secondary | ICD-10-CM | POA: Diagnosis not present

## 2021-05-01 DIAGNOSIS — M159 Polyosteoarthritis, unspecified: Secondary | ICD-10-CM | POA: Insufficient documentation

## 2021-05-01 DIAGNOSIS — H5702 Anisocoria: Secondary | ICD-10-CM | POA: Insufficient documentation

## 2021-05-01 DIAGNOSIS — M2060 Acquired deformities of toe(s), unspecified, unspecified foot: Secondary | ICD-10-CM | POA: Insufficient documentation

## 2021-05-01 DIAGNOSIS — Z7982 Long term (current) use of aspirin: Secondary | ICD-10-CM | POA: Insufficient documentation

## 2021-05-01 DIAGNOSIS — K648 Other hemorrhoids: Secondary | ICD-10-CM | POA: Insufficient documentation

## 2021-05-01 DIAGNOSIS — E119 Type 2 diabetes mellitus without complications: Secondary | ICD-10-CM | POA: Insufficient documentation

## 2021-05-01 DIAGNOSIS — J309 Allergic rhinitis, unspecified: Secondary | ICD-10-CM | POA: Insufficient documentation

## 2021-05-01 DIAGNOSIS — E1169 Type 2 diabetes mellitus with other specified complication: Secondary | ICD-10-CM | POA: Diagnosis not present

## 2021-05-01 DIAGNOSIS — B351 Tinea unguium: Secondary | ICD-10-CM

## 2021-05-01 DIAGNOSIS — K429 Umbilical hernia without obstruction or gangrene: Secondary | ICD-10-CM | POA: Insufficient documentation

## 2021-05-01 DIAGNOSIS — I10 Essential (primary) hypertension: Secondary | ICD-10-CM | POA: Insufficient documentation

## 2021-05-01 DIAGNOSIS — E7800 Pure hypercholesterolemia, unspecified: Secondary | ICD-10-CM | POA: Insufficient documentation

## 2021-05-01 DIAGNOSIS — R609 Edema, unspecified: Secondary | ICD-10-CM | POA: Insufficient documentation

## 2021-05-01 DIAGNOSIS — J45909 Unspecified asthma, uncomplicated: Secondary | ICD-10-CM | POA: Insufficient documentation

## 2021-05-01 DIAGNOSIS — I8393 Asymptomatic varicose veins of bilateral lower extremities: Secondary | ICD-10-CM | POA: Insufficient documentation

## 2021-05-01 DIAGNOSIS — E538 Deficiency of other specified B group vitamins: Secondary | ICD-10-CM | POA: Insufficient documentation

## 2021-05-01 DIAGNOSIS — H18509 Unspecified hereditary corneal dystrophies, unspecified eye: Secondary | ICD-10-CM | POA: Insufficient documentation

## 2021-05-01 DIAGNOSIS — R5383 Other fatigue: Secondary | ICD-10-CM | POA: Insufficient documentation

## 2021-05-01 DIAGNOSIS — D509 Iron deficiency anemia, unspecified: Secondary | ICD-10-CM | POA: Insufficient documentation

## 2021-05-01 DIAGNOSIS — R519 Headache, unspecified: Secondary | ICD-10-CM | POA: Insufficient documentation

## 2021-05-01 DIAGNOSIS — E1140 Type 2 diabetes mellitus with diabetic neuropathy, unspecified: Secondary | ICD-10-CM | POA: Insufficient documentation

## 2021-05-01 DIAGNOSIS — N1831 Chronic kidney disease, stage 3a: Secondary | ICD-10-CM | POA: Insufficient documentation

## 2021-05-01 DIAGNOSIS — Z0000 Encounter for general adult medical examination without abnormal findings: Secondary | ICD-10-CM | POA: Insufficient documentation

## 2021-05-01 NOTE — Progress Notes (Signed)
  Subjective:  Patient ID: Sandra Harvey, female    DOB: October 24, 1940,  MRN: [id number]  Chief Complaint  Patient presents with   Nail Problem    Trim nails    Foot Pain    The left leg area hurts and I think I have a broken vein    80 y.o. female presents for at risk foot care. Denies new issues. Objective:  Physical Exam: warm, good capillary refill, no trophic changes or ulcerative lesions, normal DP and non-palpable PT pulses and reduced sensation with monofilament. Left Foot: normal exam, no swelling, tenderness, instability; ligaments intact, full range of motion of all ankle/foot joints  Right Foot: normal exam, no swelling, tenderness, instability; ligaments intact, full range of motion of all ankle/foot joints   No images are attached to the encounter.  Assessment:   1. Onychomycosis of multiple toenails with type 2 diabetes mellitus and peripheral angiopathy (HCC)    Plan:  Patient was evaluated and treated and all questions answered.  Onychomycosis and PAD -Patient is diabetic with a qualifying condition for at risk foot care.  Procedure: Nail Debridement Type of Debridement: manual, sharp debridement. Instrumentation: Nail nipper, rotary burr. Number of Nails: 10  No follow-ups on file.

## 2021-06-07 DIAGNOSIS — I10 Essential (primary) hypertension: Secondary | ICD-10-CM | POA: Diagnosis not present

## 2021-06-07 DIAGNOSIS — Z947 Corneal transplant status: Secondary | ICD-10-CM | POA: Diagnosis not present

## 2021-06-07 DIAGNOSIS — Z961 Presence of intraocular lens: Secondary | ICD-10-CM | POA: Diagnosis not present

## 2021-07-21 DIAGNOSIS — R69 Illness, unspecified: Secondary | ICD-10-CM | POA: Diagnosis not present

## 2021-07-21 DIAGNOSIS — E785 Hyperlipidemia, unspecified: Secondary | ICD-10-CM | POA: Diagnosis not present

## 2021-07-21 DIAGNOSIS — M810 Age-related osteoporosis without current pathological fracture: Secondary | ICD-10-CM | POA: Diagnosis not present

## 2021-07-21 DIAGNOSIS — E1151 Type 2 diabetes mellitus with diabetic peripheral angiopathy without gangrene: Secondary | ICD-10-CM | POA: Diagnosis not present

## 2021-07-21 DIAGNOSIS — K219 Gastro-esophageal reflux disease without esophagitis: Secondary | ICD-10-CM | POA: Diagnosis not present

## 2021-07-21 DIAGNOSIS — E039 Hypothyroidism, unspecified: Secondary | ICD-10-CM | POA: Diagnosis not present

## 2021-07-21 DIAGNOSIS — I10 Essential (primary) hypertension: Secondary | ICD-10-CM | POA: Diagnosis not present

## 2021-07-21 DIAGNOSIS — Z008 Encounter for other general examination: Secondary | ICD-10-CM | POA: Diagnosis not present

## 2021-07-21 DIAGNOSIS — E1136 Type 2 diabetes mellitus with diabetic cataract: Secondary | ICD-10-CM | POA: Diagnosis not present

## 2021-07-21 DIAGNOSIS — G43909 Migraine, unspecified, not intractable, without status migrainosus: Secondary | ICD-10-CM | POA: Diagnosis not present

## 2021-07-21 DIAGNOSIS — R32 Unspecified urinary incontinence: Secondary | ICD-10-CM | POA: Diagnosis not present

## 2021-07-21 DIAGNOSIS — G8929 Other chronic pain: Secondary | ICD-10-CM | POA: Diagnosis not present

## 2021-07-27 ENCOUNTER — Ambulatory Visit (INDEPENDENT_AMBULATORY_CARE_PROVIDER_SITE_OTHER): Payer: Medicare HMO | Admitting: Podiatry

## 2021-07-27 ENCOUNTER — Other Ambulatory Visit: Payer: Self-pay

## 2021-07-27 ENCOUNTER — Encounter: Payer: Self-pay | Admitting: Podiatry

## 2021-07-27 DIAGNOSIS — L84 Corns and callosities: Secondary | ICD-10-CM | POA: Diagnosis not present

## 2021-07-27 DIAGNOSIS — E119 Type 2 diabetes mellitus without complications: Secondary | ICD-10-CM | POA: Diagnosis not present

## 2021-07-27 DIAGNOSIS — M2042 Other hammer toe(s) (acquired), left foot: Secondary | ICD-10-CM | POA: Diagnosis not present

## 2021-07-27 DIAGNOSIS — E1151 Type 2 diabetes mellitus with diabetic peripheral angiopathy without gangrene: Secondary | ICD-10-CM | POA: Diagnosis not present

## 2021-07-27 DIAGNOSIS — B351 Tinea unguium: Secondary | ICD-10-CM | POA: Diagnosis not present

## 2021-07-27 DIAGNOSIS — M2041 Other hammer toe(s) (acquired), right foot: Secondary | ICD-10-CM | POA: Diagnosis not present

## 2021-07-27 DIAGNOSIS — E1169 Type 2 diabetes mellitus with other specified complication: Secondary | ICD-10-CM | POA: Diagnosis not present

## 2021-07-31 ENCOUNTER — Ambulatory Visit: Payer: Medicare Other | Admitting: Podiatry

## 2021-08-03 NOTE — Progress Notes (Signed)
ANNUAL DIABETIC FOOT EXAM ? ?Subjective: ?Sandra Harvey presents today for annual diabetic foot examination. ? ?Patient relates h/o diabetes. ? ?Patient denies any h/o foot wounds. ? ?Patient has been diagnosed with neuropathy. ? ?Risk factors: diabetic neuropathy, CAD, hyperlipidemia. ? ?Deland Pretty, MD is patient's PCP. Last visit was June 07, 2021. ? ?Past Medical History:  ?Diagnosis Date  ? Allergy   ? Anxiety   ? Arthritis   ? oa  ? Bilateral inguinal hernia 04/06/2021  ? Cataract   ? Complication of anesthesia   ? slow to wake up  after 2019 colonscopy at dr hung office  ? Depression   ? Dry eye syndrome   ? both eyes  ? Fuchs' corneal dystrophy   ? GERD (gastroesophageal reflux disease)   ? GI bleed 2021  ? Headache   ? History of migraine headaches   ? none recne per pt on 04-06-2021  ? History of osteopenia   ? Hypercholesterolemia   ? Hyperlipidemia   ? Hypertension   ? off bp meds 6 months due to low bp  ? Hypothyroidism   ? Inversion, nipple   ? right  ? Stomach ulcer   ? diagnosed 08/2017   ? Type 2 DM   ? does not check cbg, last hemaglobin 04-04-2021 was 5.8 on 04-04-2021 at dr pharr office  ? ?Patient Active Problem List  ? Diagnosis Date Noted  ? Acquired deformity of toe 05/01/2021  ? Age-related osteoporosis without current pathological fracture 05/01/2021  ? Allergic rhinitis 05/01/2021  ? Anisocoria 05/01/2021  ? Asthmatic bronchitis 05/01/2021  ? Asymptomatic varicose veins of bilateral lower extremities 05/01/2021  ? Chronic kidney disease, stage 3a ([place]) 05/01/2021  ? Corneal dystrophy 05/01/2021  ? Edema 05/01/2021  ? Encounter for general adult medical examination without abnormal findings 05/01/2021  ? Essential hypertension 05/01/2021  ? Fatigue 05/01/2021  ? Generalized osteoarthritis 05/01/2021  ? Headache 05/01/2021  ? Hypocalcemia 05/01/2021  ? Internal hemorrhoids 05/01/2021  ? Iron deficiency anemia 05/01/2021  ? Long term (current) use of aspirin 05/01/2021  ?  Neuropathy due to type 2 diabetes mellitus ([place]) 05/01/2021  ? Pure hypercholesterolemia 05/01/2021  ? Recurrent major depression ([place]) 05/01/2021  ? Type 2 diabetes mellitus without complications ([place]) 99991111  ? Umbilical hernia [id number]  ? Vitamin B12 deficiency (non anemic) 05/01/2021  ? Vitamin D deficiency 05/01/2021  ? Chronic kidney disease 12/22/2020  ? Mixed anxiety and depressive disorder 12/22/2020  ? Right inguinal hernia 12/22/2020  ? Osteoporosis 10/03/2020  ? Pain in joint of left knee 03/13/2020  ? Pain of left hip joint 03/13/2020  ? Anemia 11/03/2019  ? Arthritis 03/23/2019  ? Atrophic vaginitis 03/23/2019  ? 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  ? 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  ? Menopausal symptom 03/23/2019  ? Migraine 03/23/2019  ? Onychomycosis 03/23/2019  ? Postmenopausal bleeding 03/23/2019  ? Pain in right knee 10/28/2017  ? Overweight (BMI 25.0-29.9) 09/18/2017  ? S/P right THA, 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  ? Osteoarthritis of hip 08/23/2017  ? Acute pain of left wrist 06/12/2017  ? 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  ? 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 Pseudophakia 07/23/2012  ? Diabetes mellitus ([place]) 01/23/2012  ? Cardiovascular disease 10/08/2011  ? GERD (gastroesophageal reflux disease) 10/08/2011  ? Hypothyroidism 10/08/2011  ? After-cataract 07/11/2011  ? 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  ? ?Past Surgical History:  ?Procedure Laterality Date  ? belly button surgery  2019  ? dr Lucia Gaskins  ? bilteral thumb replacement    ?  both yrs ago per pt on 04-07-2021  ? BIOPSY  10/16/2019  ? Procedure: BIOPSY;  Surgeon: Carol Ada, MD;  [facility];  Service: Endoscopy;;  ? COLONOSCOPY WITH PROPOFOL N/A 10/16/2019  ? Procedure: COLONOSCOPY WITH PROPOFOL;  Surgeon: Carol Ada, MD;  [facility];  Service: Endoscopy;  Laterality: N/A;  ? CORNEAL TRANSPLANT Bilateral   ? 2007, 2010  ? DILATION AND CURETTAGE OF UTERUS    ? yrs ago per pt on 04-06-2021  ?  ENTEROSCOPY N/A 10/16/2019  ? Procedure: ENTEROSCOPY;  Surgeon: Carol Ada, MD;  [facility];  Service: Endoscopy;  Laterality: N/A;  ? EYE SURGERY Bilateral   ? both eyes cataracts yrs ago per pt on 04-07-2021  ? HAND SURGERY  02/2011  ? INGUINAL HERNIA REPAIR Bilateral 04/10/2021  ? Procedure: LAPAROSCOPIC BILATERAL INGUINAL HERNIA REPAIR WITH MESH;  Surgeon: Stechschulte, Nickola Major, MD;  Location: [facility];  Service: General;  Laterality: Bilateral;  ? left carpal tunnel release  Left 2019  ? at [facility] [place]  ? right carpal tunnel release    ? yrs ago per pt on 04-07-2021  ? right foot morton's neuroma surgery   1990  ? 1980's and 1990's  ? right tennis elbow surgery    ? 1990's  ? THYROIDECTOMY, PARTIAL  02/1971  ? center part removed per pt  ? 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  ? Procedure: RIGHT TOTAL HIP ARTHROPLASTY ANTERIOR APPROACH;  Surgeon: Paralee Cancel, MD;  [facility];  Service: Orthopedics;  Laterality: Right;  70 mins  ? UMBILICAL HERNIA REPAIR  05/2017  ? dr Lucia Gaskins  ? UPPER GI ENDOSCOPY  2021  ? and colonscopy @ [facility]  without problems  ? ?Current Outpatient Medications on File Prior to Visit  ?Medication Sig Dispense Refill  ? acetaminophen (TYLENOL) 500 MG tablet Take 1,000 mg by mouth every 6 (six) hours as needed for moderate pain.    ? alum & mag hydroxide-simeth (MAALOX/MYLANTA) 200-200-20 MG/5ML suspension Take 5 mLs by mouth every 6 (six) hours as needed for indigestion or heartburn.    ? aspirin 81 MG EC tablet Take by mouth.    ? cetirizine (ZYRTEC) 10 MG tablet Take 10 mg by mouth daily as needed for allergies.    ? Cetirizine HCl 10 MG CAPS Take by mouth.    ? clobetasol (TEMOVATE) 0.05 % external solution clobetasol 0.05 % scalp solution ? APPLY TO AFFECTED AREA TWICE A DAY AS NEEDED (NOT TO FACE, GROIN, AXILLA)    ? Cyanocobalamin 2500 MCG SUBL 2,500 mcg.    ? denosumab (PROLIA) 60 MG/ML SOSY injection Inject 60 mg into the skin  every 6 (six) months. Scheduled 10/20/2019    ? hydrocortisone cream 1 % Apply 1 application topically daily as needed for itching.    ? imipramine (TOFRANIL) 50 MG tablet Take 50 mg by mouth at bedtime.    ? levothyroxine (SYNTHROID) 75 MCG tablet Take 75 mcg by mouth daily before breakfast. Takes 1/2 tab of 75 mcg on Sunday only and 75 mcg every other day    ? metFORMIN (GLUCOPHAGE-XR) 500 MG 24 hr tablet Take 500 mg by mouth 2 (two) times daily after a meal.    ? metFORMIN (GLUCOPHAGE-XR) 500 MG 24 hr tablet Take 1 tablet by mouth 2 (two) times daily.    ? rosuvastatin (CRESTOR) 20 MG tablet Take 20 mg by mouth daily after supper.    ? sertraline (ZOLOFT) 100 MG  tablet Take 100 mg by mouth daily after breakfast.    ? telmisartan (MICARDIS) 20 MG tablet Take 20 mg by mouth daily.    ? UNABLE TO FIND Med Name vitamin b 12  2, 500 mcg sl daily    ? VITAMIN D PO Take 100 mg by mouth 2 (two) times daily.    ? ?No current facility-administered medications on file prior to visit.  ?  ?Allergies  ?Allergen Reactions  ? Ibuprofen Nausea Only  ? Meloxicam   ?  Caused ulcer   ? Nsaids Nausea And Vomiting  ?  With prolonged use  ? ?Social History  ? ?Occupational History  ? Not on file  ?Tobacco Use  ? Smoking status: Former  ?  Packs/day: 1.00  ?  Years: 30.00  ?  Pack years: 30.00  ?  Types: Cigarettes  ?  Quit date: 11/07/1992  ?  Years since quitting: 28.7  ? Smokeless tobacco: Never  ?Vaping Use  ? Vaping Use: Never used  ?Substance and Sexual Activity  ? Alcohol use: Yes  ?  Alcohol/week: 1.0 standard drink  ?  Types: 1 Glasses of wine per week  ?  Comment: wine on occasion  2 or 3 x week  ? Drug use: No  ? Sexual activity: Yes  ?  Birth control/protection: None  ? ?Family History  ?Problem Relation Age of Onset  ? Diabetes Sister   ? Hypertension Sister   ? Breast cancer Neg Hx   ? ? ?There is no immunization history on file for this patient.  ? ?Review of Systems: Negative except as noted in the HPI.   ? ?Objective: ?There were no vitals filed for this visit. ? ?Alicia Reinhold Strain is a pleasant 81 y.o. female in NAD. AAO X 3. ? ?Vascular Examination: ?CFT <3 seconds b/l LE. Palpable DP pulse(s) b/l LE. Diminished PT pulse(s) b

## 2021-08-21 DIAGNOSIS — G43719 Chronic migraine without aura, intractable, without status migrainosus: Secondary | ICD-10-CM | POA: Diagnosis not present

## 2021-09-06 DIAGNOSIS — E119 Type 2 diabetes mellitus without complications: Secondary | ICD-10-CM | POA: Diagnosis not present

## 2021-09-06 DIAGNOSIS — I10 Essential (primary) hypertension: Secondary | ICD-10-CM | POA: Diagnosis not present

## 2021-10-05 DIAGNOSIS — I10 Essential (primary) hypertension: Secondary | ICD-10-CM | POA: Diagnosis not present

## 2021-11-02 DIAGNOSIS — E039 Hypothyroidism, unspecified: Secondary | ICD-10-CM | POA: Diagnosis not present

## 2021-11-02 DIAGNOSIS — I10 Essential (primary) hypertension: Secondary | ICD-10-CM | POA: Diagnosis not present

## 2021-11-09 ENCOUNTER — Ambulatory Visit: Payer: Medicare HMO | Admitting: Podiatry

## 2021-11-14 DIAGNOSIS — E1129 Type 2 diabetes mellitus with other diabetic kidney complication: Secondary | ICD-10-CM | POA: Diagnosis not present

## 2021-11-14 DIAGNOSIS — M810 Age-related osteoporosis without current pathological fracture: Secondary | ICD-10-CM | POA: Diagnosis not present

## 2021-11-14 DIAGNOSIS — R809 Proteinuria, unspecified: Secondary | ICD-10-CM | POA: Diagnosis not present

## 2021-11-14 DIAGNOSIS — I10 Essential (primary) hypertension: Secondary | ICD-10-CM | POA: Diagnosis not present

## 2021-11-14 DIAGNOSIS — E7800 Pure hypercholesterolemia, unspecified: Secondary | ICD-10-CM | POA: Diagnosis not present

## 2021-11-14 DIAGNOSIS — D509 Iron deficiency anemia, unspecified: Secondary | ICD-10-CM | POA: Diagnosis not present

## 2021-11-14 DIAGNOSIS — E039 Hypothyroidism, unspecified: Secondary | ICD-10-CM | POA: Diagnosis not present

## 2021-11-23 ENCOUNTER — Ambulatory Visit: Payer: Medicare HMO | Admitting: Podiatry

## 2021-11-23 DIAGNOSIS — M79675 Pain in left toe(s): Secondary | ICD-10-CM

## 2021-11-23 DIAGNOSIS — B351 Tinea unguium: Secondary | ICD-10-CM

## 2021-11-23 DIAGNOSIS — M79674 Pain in right toe(s): Secondary | ICD-10-CM | POA: Diagnosis not present

## 2021-11-23 DIAGNOSIS — E1151 Type 2 diabetes mellitus with diabetic peripheral angiopathy without gangrene: Secondary | ICD-10-CM | POA: Diagnosis not present

## 2021-11-23 DIAGNOSIS — L84 Corns and callosities: Secondary | ICD-10-CM | POA: Diagnosis not present

## 2021-11-29 DIAGNOSIS — Z01419 Encounter for gynecological examination (general) (routine) without abnormal findings: Secondary | ICD-10-CM | POA: Diagnosis not present

## 2021-11-30 ENCOUNTER — Encounter: Payer: Self-pay | Admitting: Podiatry

## 2021-11-30 DIAGNOSIS — E785 Hyperlipidemia, unspecified: Secondary | ICD-10-CM | POA: Insufficient documentation

## 2021-11-30 DIAGNOSIS — B3731 Acute candidiasis of vulva and vagina: Secondary | ICD-10-CM | POA: Insufficient documentation

## 2021-11-30 DIAGNOSIS — Z8781 Personal history of (healed) traumatic fracture: Secondary | ICD-10-CM | POA: Insufficient documentation

## 2021-11-30 DIAGNOSIS — H189 Unspecified disorder of cornea: Secondary | ICD-10-CM | POA: Insufficient documentation

## 2021-11-30 DIAGNOSIS — I10 Essential (primary) hypertension: Secondary | ICD-10-CM | POA: Diagnosis not present

## 2021-11-30 DIAGNOSIS — W5503XA Scratched by cat, initial encounter: Secondary | ICD-10-CM | POA: Diagnosis not present

## 2021-11-30 DIAGNOSIS — S81802A Unspecified open wound, left lower leg, initial encounter: Secondary | ICD-10-CM | POA: Diagnosis not present

## 2021-11-30 DIAGNOSIS — E1121 Type 2 diabetes mellitus with diabetic nephropathy: Secondary | ICD-10-CM | POA: Insufficient documentation

## 2021-11-30 DIAGNOSIS — N390 Urinary tract infection, site not specified: Secondary | ICD-10-CM | POA: Insufficient documentation

## 2021-11-30 NOTE — Progress Notes (Signed)
  Subjective:  Patient ID: Sandra Harvey, female    DOB: Feb 19, 1941,  MRN: [id number]  Sandra Harvey presents to clinic today for at risk foot care. Pt has h/o NIDDM with PAD and corn(s) R 3rd toe and painful thick toenails that are difficult to trim. Painful toenails interfere with ambulation. Aggravating factors include wearing enclosed shoe gear. Pain is relieved with periodic professional debridement. Painful corns are aggravated when weightbearing when wearing enclosed shoe gear. Pain is relieved with periodic professional debridement.  Last A1c was 5.9%. Patient does not monitor blood glucose daily.  Patient states she and her doctor are working to get her blood pressure regulated. It has been running low.  New problem(s): None.   PCP is Merri Brunette, MD , and last visit was  November 16, 2021  Allergies  Allergen Reactions   Ibuprofen Nausea Only   Amlodipine     Other reaction(s): tore nerves to pieces   Fluvastatin     Other reaction(s): Elevated transaminases   Meloxicam     Caused ulcer    Nsaids Nausea And Vomiting    With prolonged use    Review of Systems: Negative except as noted in the HPI.  Objective: No changes noted in today's physical examination. There were no vitals filed for this visit.  Sandra Harvey is a pleasant 81 y.o. female in NAD. AAO X 3.  Vascular Examination: CFT <3 seconds b/l LE. Palpable DP pulse(s) b/l LE. Diminished PT pulse(s) b/l LE. No pain with calf compression b/l. Lower extremity skin temperature gradient within normal limits. Trace edema noted BLE. Varicosities present b/l. No ischemia or gangrene noted b/l LE. No cyanosis or clubbing noted b/l LE.  Dermatological Examination: Pedal skin thin, shiny and atrophic b/l LE. No open wounds b/l LE. No interdigital macerations noted b/l LE. Toenails 1-5 b/l elongated, discolored, dystrophic, thickened, crumbly with subungual debris and tenderness to dorsal  palpation. Hyperkeratotic lesion(s) R 3rd toe.  No erythema, no edema, no drainage, no fluctuance.  Neurological Examination: Protective sensation decreased with 10 gram monofilament b/l. Vibratory sensation decreased b/l.  Musculoskeletal Examination: Muscle strength 5/5 to all lower extremity muscle groups bilaterally. Hammertoe deformity noted 2-5 b/l.  Assessment/Plan: 1. Onychomycosis of multiple toenails with type 2 diabetes mellitus and peripheral angiopathy (HCC)   2. Corns   3. Diabetes mellitus type 2 with peripheral artery disease (HCC)      -Examined patient. -Continue foot and shoe inspections daily. Monitor blood glucose per PCP/Endocrinologist's recommendations. -Patient to continue soft, supportive shoe gear daily. -Mycotic toenails 1-5 bilaterally were debrided in length and girth with sterile nail nippers and dremel without incident. -Corn(s) R 3rd toe pared utilizing sterile scalpel blade without complication or incident. Total number debrided=1. -Patient/POA to call should there be question/concern in the interim.   Return in about 3 months (around 02/23/2022).  Freddie Breech, DPM

## 2021-12-01 DIAGNOSIS — S81812A Laceration without foreign body, left lower leg, initial encounter: Secondary | ICD-10-CM | POA: Diagnosis not present

## 2021-12-01 DIAGNOSIS — Z7984 Long term (current) use of oral hypoglycemic drugs: Secondary | ICD-10-CM | POA: Diagnosis not present

## 2021-12-01 DIAGNOSIS — Z23 Encounter for immunization: Secondary | ICD-10-CM | POA: Diagnosis not present

## 2021-12-01 DIAGNOSIS — I10 Essential (primary) hypertension: Secondary | ICD-10-CM | POA: Diagnosis not present

## 2021-12-01 DIAGNOSIS — S80812A Abrasion, left lower leg, initial encounter: Secondary | ICD-10-CM | POA: Diagnosis not present

## 2021-12-01 DIAGNOSIS — Z79890 Hormone replacement therapy (postmenopausal): Secondary | ICD-10-CM | POA: Diagnosis not present

## 2021-12-01 DIAGNOSIS — E119 Type 2 diabetes mellitus without complications: Secondary | ICD-10-CM | POA: Diagnosis not present

## 2021-12-01 DIAGNOSIS — W5503XA Scratched by cat, initial encounter: Secondary | ICD-10-CM | POA: Diagnosis not present

## 2021-12-01 DIAGNOSIS — Z87891 Personal history of nicotine dependence: Secondary | ICD-10-CM | POA: Diagnosis not present

## 2021-12-01 DIAGNOSIS — Z79899 Other long term (current) drug therapy: Secondary | ICD-10-CM | POA: Diagnosis not present

## 2021-12-04 DIAGNOSIS — S81812D Laceration without foreign body, left lower leg, subsequent encounter: Secondary | ICD-10-CM | POA: Diagnosis not present

## 2021-12-04 DIAGNOSIS — W5503XA Scratched by cat, initial encounter: Secondary | ICD-10-CM | POA: Diagnosis not present

## 2021-12-14 DIAGNOSIS — Z87891 Personal history of nicotine dependence: Secondary | ICD-10-CM | POA: Diagnosis not present

## 2021-12-14 DIAGNOSIS — L539 Erythematous condition, unspecified: Secondary | ICD-10-CM | POA: Diagnosis not present

## 2021-12-14 DIAGNOSIS — Z5189 Encounter for other specified aftercare: Secondary | ICD-10-CM | POA: Diagnosis not present

## 2021-12-14 DIAGNOSIS — I10 Essential (primary) hypertension: Secondary | ICD-10-CM | POA: Diagnosis not present

## 2021-12-14 DIAGNOSIS — Z4801 Encounter for change or removal of surgical wound dressing: Secondary | ICD-10-CM | POA: Diagnosis not present

## 2021-12-14 DIAGNOSIS — E119 Type 2 diabetes mellitus without complications: Secondary | ICD-10-CM | POA: Diagnosis not present

## 2021-12-25 DIAGNOSIS — E039 Hypothyroidism, unspecified: Secondary | ICD-10-CM | POA: Diagnosis not present

## 2021-12-25 DIAGNOSIS — R69 Illness, unspecified: Secondary | ICD-10-CM | POA: Diagnosis not present

## 2021-12-25 DIAGNOSIS — Z23 Encounter for immunization: Secondary | ICD-10-CM | POA: Diagnosis not present

## 2021-12-25 DIAGNOSIS — Z7982 Long term (current) use of aspirin: Secondary | ICD-10-CM | POA: Diagnosis not present

## 2021-12-25 DIAGNOSIS — N1831 Chronic kidney disease, stage 3a: Secondary | ICD-10-CM | POA: Diagnosis not present

## 2021-12-25 DIAGNOSIS — I700 Atherosclerosis of aorta: Secondary | ICD-10-CM | POA: Diagnosis not present

## 2021-12-25 DIAGNOSIS — E1140 Type 2 diabetes mellitus with diabetic neuropathy, unspecified: Secondary | ICD-10-CM | POA: Diagnosis not present

## 2021-12-25 DIAGNOSIS — I10 Essential (primary) hypertension: Secondary | ICD-10-CM | POA: Diagnosis not present

## 2021-12-25 DIAGNOSIS — M810 Age-related osteoporosis without current pathological fracture: Secondary | ICD-10-CM | POA: Diagnosis not present

## 2021-12-25 DIAGNOSIS — E538 Deficiency of other specified B group vitamins: Secondary | ICD-10-CM | POA: Diagnosis not present

## 2021-12-25 DIAGNOSIS — Z0000 Encounter for general adult medical examination without abnormal findings: Secondary | ICD-10-CM | POA: Diagnosis not present

## 2021-12-25 DIAGNOSIS — K219 Gastro-esophageal reflux disease without esophagitis: Secondary | ICD-10-CM | POA: Diagnosis not present

## 2022-01-03 ENCOUNTER — Other Ambulatory Visit: Payer: Self-pay | Admitting: Obstetrics and Gynecology

## 2022-01-03 DIAGNOSIS — Z1231 Encounter for screening mammogram for malignant neoplasm of breast: Secondary | ICD-10-CM

## 2022-01-19 ENCOUNTER — Ambulatory Visit
Admission: RE | Admit: 2022-01-19 | Discharge: 2022-01-19 | Disposition: A | Discharge status: Home / Self Care | Payer: Medicare HMO | Source: Ambulatory Visit | Attending: Obstetrics and Gynecology | Admitting: Obstetrics and Gynecology

## 2022-01-19 ENCOUNTER — Ambulatory Visit: Payer: Medicare Other

## 2022-01-19 DIAGNOSIS — Z1231 Encounter for screening mammogram for malignant neoplasm of breast: Secondary | ICD-10-CM

## 2022-01-31 DIAGNOSIS — R630 Anorexia: Secondary | ICD-10-CM | POA: Diagnosis not present

## 2022-01-31 DIAGNOSIS — R5383 Other fatigue: Secondary | ICD-10-CM | POA: Diagnosis not present

## 2022-01-31 DIAGNOSIS — E559 Vitamin D deficiency, unspecified: Secondary | ICD-10-CM | POA: Diagnosis not present

## 2022-01-31 DIAGNOSIS — R0602 Shortness of breath: Secondary | ICD-10-CM | POA: Diagnosis not present

## 2022-01-31 DIAGNOSIS — E538 Deficiency of other specified B group vitamins: Secondary | ICD-10-CM | POA: Diagnosis not present

## 2022-01-31 DIAGNOSIS — I10 Essential (primary) hypertension: Secondary | ICD-10-CM | POA: Diagnosis not present

## 2022-02-15 DIAGNOSIS — Z87891 Personal history of nicotine dependence: Secondary | ICD-10-CM | POA: Diagnosis not present

## 2022-02-15 DIAGNOSIS — R0602 Shortness of breath: Secondary | ICD-10-CM | POA: Diagnosis not present

## 2022-02-15 DIAGNOSIS — Z724 Inappropriate diet and eating habits: Secondary | ICD-10-CM | POA: Diagnosis not present

## 2022-02-15 DIAGNOSIS — Z23 Encounter for immunization: Secondary | ICD-10-CM | POA: Diagnosis not present

## 2022-02-15 DIAGNOSIS — J986 Disorders of diaphragm: Secondary | ICD-10-CM | POA: Diagnosis not present

## 2022-02-15 DIAGNOSIS — R079 Chest pain, unspecified: Secondary | ICD-10-CM | POA: Diagnosis not present

## 2022-02-15 DIAGNOSIS — D649 Anemia, unspecified: Secondary | ICD-10-CM | POA: Diagnosis not present

## 2022-02-19 ENCOUNTER — Telehealth: Payer: Self-pay | Admitting: Hematology and Oncology

## 2022-02-19 NOTE — Telephone Encounter (Signed)
Scheduled appointment per 10/02. Patient is aware of appointment date and time. Patient is aware to arrive 15 mins prior to appointment time and to bring updated insurance cards. Patient is aware of location.   

## 2022-03-01 ENCOUNTER — Ambulatory Visit: Payer: Medicare HMO | Admitting: Podiatry

## 2022-03-01 DIAGNOSIS — L84 Corns and callosities: Secondary | ICD-10-CM

## 2022-03-01 DIAGNOSIS — B351 Tinea unguium: Secondary | ICD-10-CM | POA: Diagnosis not present

## 2022-03-01 DIAGNOSIS — E1169 Type 2 diabetes mellitus with other specified complication: Secondary | ICD-10-CM

## 2022-03-01 DIAGNOSIS — E1151 Type 2 diabetes mellitus with diabetic peripheral angiopathy without gangrene: Secondary | ICD-10-CM

## 2022-03-01 NOTE — Progress Notes (Signed)
  Subjective:  Patient ID: Sandra Harvey, female    DOB: Aug 30, 1940,  MRN: [id number]  Sandra Harvey presents to clinic today for:  Chief Complaint  Patient presents with   Nail Problem    Diabetic foot care BS-do not check A1C-6.0 PCP-Pharr PCP VST-02/15/2022   New problem(s): None.   PCP is Deland Pretty, MD , and last visit was  February 15, 2022.  Allergies  Allergen Reactions   Ibuprofen Nausea Only   Amlodipine     Other reaction(s): tore nerves to pieces   Fluvastatin     Other reaction(s): Elevated transaminases   Meloxicam     Caused ulcer    Nsaids Nausea And Vomiting    With prolonged use    Review of Systems: Negative except as noted in the HPI.  Objective: No changes noted in today's physical examination.  Sandra Harvey is a pleasant 81 y.o. female in NAD. AAO x 3.  Vascular Examination: CFT <3 seconds b/l LE. Palpable DP pulse(s) b/l LE. Diminished PT pulse(s) b/l LE. No pain with calf compression b/l. Lower extremity skin temperature gradient within normal limits. Trace edema noted BLE. Varicosities present b/l. No ischemia or gangrene noted b/l LE. No cyanosis or clubbing noted b/l LE.  Dermatological Examination: Pedal skin thin, shiny and atrophic b/l LE. No open wounds b/l LE. No interdigital macerations noted b/l LE. Toenails 1-5 b/l elongated, discolored, dystrophic, thickened, crumbly with subungual debris and tenderness to dorsal palpation.   Resolved hyperkeratotic lesion(s) R 3rd toe.  No erythema, no edema, no drainage, no fluctuance.  Neurological Examination: Protective sensation decreased with 10 gram monofilament b/l. Vibratory sensation decreased b/l.  Musculoskeletal Examination: Muscle strength 5/5 to all lower extremity muscle groups bilaterally. Hammertoe deformity noted 2-5 b/l.  Assessment/Plan: 1. Onychomycosis of multiple toenails with type 2 diabetes mellitus and peripheral angiopathy ([place])   2.  Diabetes mellitus type 2 with peripheral artery disease (HCC)     No orders of the defined types were placed in this encounter.   -Patient was evaluated and treated. All patient's and/or POA's questions/concerns answered on today's visit. -Consent given for treatment as described below: -Continue diabetic foot care principles: inspect feet daily, monitor glucose as recommended by PCP and/or Endocrinologist, and follow prescribed diet per PCP, Endocrinologist and/or dietician. -Toenails 1-5 b/l were debrided in length and girth with sterile nail nippers and dremel without iatrogenic bleeding.  -Patient/POA to call should there be question/concern in the interim.   Return in about 3 months (around 06/01/2022).  Marzetta Board, DPM

## 2022-03-02 ENCOUNTER — Ambulatory Visit: Payer: Medicare HMO | Admitting: Pulmonary Disease

## 2022-03-02 ENCOUNTER — Encounter: Payer: Self-pay | Admitting: Pulmonary Disease

## 2022-03-02 VITALS — BP 140/70 | HR 72 | Ht 59.5 in | Wt 143.2 lb

## 2022-03-02 DIAGNOSIS — R0602 Shortness of breath: Secondary | ICD-10-CM | POA: Diagnosis not present

## 2022-03-02 MED ORDER — ALBUTEROL SULFATE HFA 108 (90 BASE) MCG/ACT IN AERS: 2.0000 | INHALATION_SPRAY | Freq: Four times a day (QID) | RESPIRATORY_TRACT | 6 refills | Status: AC | PRN

## 2022-03-02 NOTE — Progress Notes (Signed)
Sandra Harvey    [id number]    [birth year]  Primary Care Physician:Pharr, Zollie Beckers, MD  Referring Physician: Merri Brunette, MD [address] [place],  [place] [address]  Chief complaint:   Shortness of breath on exertion  HPI:  Most significant shortness of breath in the last 8 to 9 months  Started with changes to her blood pressure medications She did not get sick Did not have any respiratory tract infection that she can recollect, was not treated for bronchitis  Medication changes was associated with fatigue and low blood pressure and medications are to be changed again  Quit smoking in 1993 was smoking up to 2 to 3 packs a day  Was never diagnosed with any lung disease  She was able to function well into the last year, taking care of her own yard, leaf blowing -Has found this difficult this year Sometimes just bending over to pick something up can lead to shortness of breath  She has no wheezing Denies any significant chest tightness She has had decreasing activity level  No weight loss  Decreased appetite  Outpatient Encounter Medications as of 03/02/2022  Medication Sig   cetirizine (ZYRTEC) 10 MG tablet Take 10 mg by mouth daily as needed for allergies.   acetaminophen (TYLENOL) 500 MG tablet Take 1,000 mg by mouth every 6 (six) hours as needed for moderate pain.   alum & mag hydroxide-simeth (MAALOX/MYLANTA) 200-200-20 MG/5ML suspension Take 5 mLs by mouth every 6 (six) hours as needed for indigestion or heartburn.   aspirin 81 MG EC tablet Take by mouth.   Cetirizine HCl 10 MG CAPS Take by mouth.   clobetasol (TEMOVATE) 0.05 % external solution clobetasol 0.05 % scalp solution  APPLY TO AFFECTED AREA TWICE A DAY AS NEEDED (NOT TO FACE, GROIN, AXILLA)   Cyanocobalamin 2500 MCG SUBL 2,500 mcg.   denosumab (PROLIA) 60 MG/ML SOSY injection Inject 60 mg into the skin every 6 (six) months. Scheduled 10/20/2019   hydrocortisone cream 1  % Apply 1 application topically daily as needed for itching.   levothyroxine (SYNTHROID) 75 MCG tablet Take 75 mcg by mouth daily before breakfast. Takes 1/2 tab of 75 mcg on Sunday only and 75 mcg every other day   metFORMIN (GLUCOPHAGE-XR) 500 MG 24 hr tablet Take 500 mg by mouth 2 (two) times daily after a meal.   metFORMIN (GLUCOPHAGE-XR) 500 MG 24 hr tablet Take 1 tablet by mouth 2 (two) times daily.   rosuvastatin (CRESTOR) 20 MG tablet Take 20 mg by mouth daily after supper.   sertraline (ZOLOFT) 100 MG tablet Take 100 mg by mouth daily after breakfast.   telmisartan (MICARDIS) 20 MG tablet Take 20 mg by mouth daily.   UNABLE TO FIND Med Name vitamin b 12  2, 500 mcg sl daily   VITAMIN D PO Take 100 mg by mouth 2 (two) times daily.   [DISCONTINUED] imipramine (TOFRANIL) 50 MG tablet Take 50 mg by mouth at bedtime.   No facility-administered encounter medications on file as of 03/02/2022.    Allergies as of 03/02/2022 - Review Complete 03/02/2022  Allergen Reaction Noted   Ibuprofen Nausea Only 10/08/2011   Amlodipine  11/20/2021   Fluvastatin  11/20/2021   Meloxicam  09/11/2017   Nsaids Nausea And Vomiting 07/09/2011    Past Medical History:  Diagnosis Date   Allergy    Anxiety    Arthritis    oa   Bilateral  inguinal hernia 04/06/2021   Cataract    Complication of anesthesia    slow to wake up  after 2019 colonscopy at dr hung office   Depression    Dry eye syndrome    both eyes   Fuchs' corneal dystrophy    GERD (gastroesophageal reflux disease)    GI bleed 2021   Headache    History of migraine headaches    none recne per pt on 04-06-2021   History of osteopenia    Hypercholesterolemia    Hyperlipidemia    Hypertension    off bp meds 6 months due to low bp   Hypothyroidism    Inversion, nipple    right   Stomach ulcer    diagnosed 08/2017    Type 2 DM    does not check cbg, last hemaglobin 04-04-2021 was 5.8 on 04-04-2021 at dr pharr office    Past  Surgical History:  Procedure Laterality Date   belly button surgery  2019   dr Lucia Gaskins   bilteral thumb replacement     both yrs ago per pt on 04-07-2021   BIOPSY  10/16/2019   Procedure: BIOPSY;  Surgeon: Carol Ada, MD;  [facility];  Service: Endoscopy;;   COLONOSCOPY WITH PROPOFOL N/A 10/16/2019   Procedure: COLONOSCOPY WITH PROPOFOL;  Surgeon: Carol Ada, MD;  [facility];  Service: Endoscopy;  Laterality: N/A;   CORNEAL TRANSPLANT Bilateral    2007, 2010   DILATION AND CURETTAGE OF UTERUS     yrs ago per pt on 04-06-2021   ENTEROSCOPY N/A 10/16/2019   Procedure: ENTEROSCOPY;  Surgeon: Carol Ada, MD;  [facility];  Service: Endoscopy;  Laterality: N/A;   EYE SURGERY Bilateral    both eyes cataracts yrs ago per pt on 04-07-2021   HAND SURGERY  02/2011   INGUINAL HERNIA REPAIR Bilateral 04/10/2021   Procedure: LAPAROSCOPIC BILATERAL INGUINAL HERNIA REPAIR WITH MESH;  Surgeon: Felicie Morn, MD;  Location: [facility];  Service: General;  Laterality: Bilateral;   left carpal tunnel release  Left 2019   at [facility] [place]   right carpal tunnel release     yrs ago per pt on 04-07-2021   right foot morton's neuroma surgery   1990   1980's and 1990's   right tennis elbow surgery     1990's   THYROIDECTOMY, PARTIAL  02/1971   center part removed per pt   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   Procedure: RIGHT TOTAL HIP ARTHROPLASTY ANTERIOR APPROACH;  Surgeon: Paralee Cancel, MD;  [facility];  Service: Orthopedics;  Laterality: Right;  70 mins   UMBILICAL HERNIA REPAIR  05/2017   dr Lucia Gaskins   UPPER GI ENDOSCOPY  2021   and colonscopy @ [facility]  without problems    Family History  Problem Relation Age of Onset   Diabetes Sister    Hypertension Sister    Breast cancer Neg Hx     Social History   Socioeconomic History   Marital status: Married    Spouse name: Not on file   Number of  children: Not on file   Years of education: Not on file   Highest education level: Not on file  Occupational History   Not on file  Tobacco Use   Smoking status: Former    Packs/day: 1.00    Years: 30.00    Total pack years: 30.00    Types: Cigarettes    Quit date: 11/07/1992  Years since quitting: 29.3   Smokeless tobacco: Never  Vaping Use   Vaping Use: Never used  Substance and Sexual Activity   Alcohol use: Yes    Alcohol/week: 1.0 standard drink of alcohol    Types: 1 Glasses of wine per week    Comment: wine on occasion  2 or 3 x week   Drug use: No   Sexual activity: Yes    Birth control/protection: None  Other Topics Concern   Not on file  Social History Narrative   Not on file   Social Determinants of Health   Financial Resource Strain: Not on file  Food Insecurity: Not on file  Transportation Needs: Not on file  Physical Activity: Not on file  Stress: Not on file  Social Connections: Not on file  Intimate Partner Violence: Not on file    Review of Systems  Constitutional:  Negative for fatigue.  Respiratory:  Positive for shortness of breath.     Vitals:   03/02/22 1319  BP: (!) 140/70  Pulse: 72  SpO2: 96%     Physical Exam Constitutional:      Appearance: Normal appearance.  HENT:     Head: Normocephalic.     Mouth/Throat:     Mouth: Mucous membranes are moist.  Cardiovascular:     Rate and Rhythm: Normal rate and regular rhythm.     Heart sounds: No murmur heard.    No friction rub.  Pulmonary:     Effort: No respiratory distress.     Breath sounds: No stridor.  Musculoskeletal:     Cervical back: No rigidity.  Neurological:     Mental Status: She is alert.  Psychiatric:        Mood and Affect: Mood normal.    Data Reviewed: Chest x-ray with elevated right hemidiaphragm, no acute infiltrate  Assessment:  Shortness of breath with exertion  Past history of smoking  Shortness of breath likely related to  deconditioning  Plan/Recommendations: Albuterol use as needed  Obtain pulmonary function test because of a history of smoking  Chest x-ray reviewed showing no acute infiltrate with slight elevation in the right hemidiaphragm-this is unlikely to be a major factor in her shortness of breath  Inhaler technique was discussed and reviewed with the patient  I will see her back in about 6 to 8 weeks Obtain PFT at next visit  Virl Diamond MD [facility] 03/02/2022, 1:36 PM  CC: Merri Brunette, MD

## 2022-03-02 NOTE — Patient Instructions (Signed)
Albuterol inhaler to be used as needed  Graded exercise as tolerated  PFT at next visit in 6 weeks  Follow-up in 6 to 8 weeks

## 2022-03-04 ENCOUNTER — Encounter: Payer: Self-pay | Admitting: Podiatry

## 2022-03-12 ENCOUNTER — Inpatient Hospital Stay: Payer: Medicare HMO

## 2022-03-12 ENCOUNTER — Other Ambulatory Visit: Payer: Self-pay

## 2022-03-12 ENCOUNTER — Inpatient Hospital Stay: Payer: Medicare HMO | Attending: Hematology and Oncology | Admitting: Hematology and Oncology

## 2022-03-12 VITALS — BP 157/85 | HR 75 | Temp 97.7°F | Resp 15 | Wt 144.3 lb

## 2022-03-12 DIAGNOSIS — D649 Anemia, unspecified: Secondary | ICD-10-CM | POA: Diagnosis not present

## 2022-03-12 DIAGNOSIS — Z87891 Personal history of nicotine dependence: Secondary | ICD-10-CM | POA: Diagnosis not present

## 2022-03-12 LAB — CMP (CANCER CENTER ONLY)
ALT: 13 U/L (ref 0–44)
AST: 25 U/L (ref 15–41)
Albumin: 4.2 g/dL (ref 3.5–5.0)
Alkaline Phosphatase: 51 U/L (ref 38–126)
Anion gap: 7 (ref 5–15)
BUN: 16 mg/dL (ref 8–23)
CO2: 30 mmol/L (ref 22–32)
Calcium: 9.4 mg/dL (ref 8.9–10.3)
Chloride: 104 mmol/L (ref 98–111)
Creatinine: 0.83 mg/dL (ref 0.44–1.00)
GFR, Estimated: >60 mL/min (ref >60)
Glucose, Bld: 89 mg/dL (ref 70–99)
Potassium: 4 mmol/L (ref 3.5–5.1)
Sodium: 141 mmol/L (ref 135–145)
Total Bilirubin: 0.4 mg/dL (ref 0.3–1.2)
Total Protein: 7.1 g/dL (ref 6.5–8.1)

## 2022-03-12 LAB — IRON AND IRON BINDING CAPACITY (CC-WL,HP ONLY)
Iron: 15 ug/dL — ABNORMAL LOW (ref 28–170)
Saturation Ratios: 3 % — ABNORMAL LOW (ref 10.4–31.8)
TIBC: 532 ug/dL — ABNORMAL HIGH (ref 250–450)
UIBC: 517 ug/dL — ABNORMAL HIGH (ref 148–442)

## 2022-03-12 LAB — CBC WITH DIFFERENTIAL (CANCER CENTER ONLY)
Abs Immature Granulocytes: 0.01 10*3/uL (ref 0.00–0.07)
Basophils Absolute: 0 10*3/uL (ref 0.0–0.1)
Basophils Relative: 1 %
Eosinophils Absolute: 0.1 10*3/uL (ref 0.0–0.5)
Eosinophils Relative: 1 %
HCT: 35.2 % — ABNORMAL LOW (ref 36.0–46.0)
Hemoglobin: 10.5 g/dL — ABNORMAL LOW (ref 12.0–15.0)
Immature Granulocytes: 0 %
Lymphocytes Relative: 23 %
Lymphs Abs: 1.4 10*3/uL (ref 0.7–4.0)
MCH: 24.4 pg — ABNORMAL LOW (ref 26.0–34.0)
MCHC: 29.8 g/dL — ABNORMAL LOW (ref 30.0–36.0)
MCV: 81.7 fL (ref 80.0–100.0)
Monocytes Absolute: 0.5 10*3/uL (ref 0.1–1.0)
Monocytes Relative: 8 %
Neutro Abs: 4.2 10*3/uL (ref 1.7–7.7)
Neutrophils Relative %: 67 %
Platelet Count: 350 10*3/uL (ref 150–400)
RBC: 4.31 MIL/uL (ref 3.87–5.11)
RDW: 15.2 % (ref 11.5–15.5)
WBC Count: 6.3 10*3/uL (ref 4.0–10.5)
nRBC: 0 % (ref 0.0–0.2)

## 2022-03-12 LAB — VITAMIN B12: Vitamin B-12: 942 pg/mL — ABNORMAL HIGH (ref 180–914)

## 2022-03-12 LAB — RETIC PANEL
Immature Retic Fract: 20 % — ABNORMAL HIGH (ref 2.3–15.9)
RBC.: 4.28 MIL/uL (ref 3.87–5.11)
Retic Count, Absolute: 43.7 10*3/uL (ref 19.0–186.0)
Retic Ct Pct: 1 % (ref 0.4–3.1)
Reticulocyte Hemoglobin: 22.9 pg — ABNORMAL LOW (ref >27.9)

## 2022-03-12 LAB — FOLATE: Folate: 18.5 ng/mL (ref >5.9)

## 2022-03-12 LAB — LACTATE DEHYDROGENASE: LDH: 211 U/L — ABNORMAL HIGH (ref 98–192)

## 2022-03-12 NOTE — Progress Notes (Signed)
[place] [phone]   Fax:([phone]) [place] NOTE  Patient Care Team: Deland Pretty, MD as PCP - General (Internal Medicine)  Hematological/Oncological History # Normocytic Anemia 09/19/2017: WBC 9.5, Hgb 9.4, MCV 93.4, Plt 261 04/10/2021: Hgb 13.3 12/21/2021: Hgb 10.4, MCV 83.6, Plt 347, WBC 5.6 03/12/2022: establish care with Dr. Lorenso Courier   CHIEF COMPLAINTS/PURPOSE OF CONSULTATION:  "Normocytic Anemia "  HISTORY OF PRESENTING ILLNESS:  Sandra Harvey 81 y.o. female with medical history significant for GERD, anxiety, arthritis, depression, migraines, HLD, hypothryoidism, hypertension who presented for evaluation of anemia.   On review of the previous records Sandra Harvey last had normal hemoglobin on 04/10/2021 as time she was noted to have a hemoglobin of 13.3.  Before that on 09/19/2017 she had white blood cell count 9.5, hemoglobin 9.4, MCV 93.4, and platelets of 261.  Most recently on 12/21/2021 patient had a hemoglobin 10.4 with MCV of 83.6.  Platelets were 347 and white blood cell count 5.6.  Due to concern for these findings the patient was referred to hematology for further evaluation and management.  On exam today Sandra Harvey reports that she is currently being evaluated for shortness of breath.  She met with pulmonary doctor to evaluate this further and was not noted to have any clear lung disease.  She reports that she has not been eating well lately because she "is not hungry".  She is taking once a day vitamins though she is not able to take vitamins with iron as this causes stomach upset.  She reports that she does have a history of stomach ulcers in the past and is not currently taking pantoprazole but is taking Mylanta and Maalox when she has recurrent symptoms.  She is not having any issues with recent abdominal discomfort.  She reports that she does not eat much in the way of red meat and does prefer chicken.  She has had no overt  bleeding such as nosebleeds, gum bleeding, or dark stools.  On further discussion she notes her mother had a stroke, her father emphysema, and paternal grandfather died of a heart attack at age 34.  She has no biological children.  She smoked for 30 years and then quit 30 years ago.  She drinks half a glass of wine frequently, but not every night.  She reports that she previously worked at [employer].  She notes that does occasionally have episodes of dizziness but other than that and her shortness of breath is not having any symptoms of anemia.  She reports that she has been seen by Dr. Benson Norway in GI in the past.  She otherwise denies any fevers, chills, sweats, nausea, vomiting, or diarrhea.  A full 10 point ROS is listed below.  MEDICAL HISTORY:  Past Medical History:  Diagnosis Date   Allergy    Anxiety    Arthritis    oa   Bilateral inguinal hernia 04/06/2021   Cataract    Complication of anesthesia    slow to wake up  after 2019 colonscopy at dr hung office   Depression    Dry eye syndrome    both eyes   Fuchs' corneal dystrophy    GERD (gastroesophageal reflux disease)    GI bleed 2021   Headache    History of migraine headaches    none recne per pt on 04-06-2021   History of osteopenia    Hypercholesterolemia    Hyperlipidemia    Hypertension    off  bp meds 6 months due to low bp   Hypothyroidism    Inversion, nipple    right   Stomach ulcer    diagnosed 08/2017    Type 2 DM    does not check cbg, last hemaglobin 04-04-2021 was 5.8 on 04-04-2021 at dr pharr office    SURGICAL HISTORY: Past Surgical History:  Procedure Laterality Date   belly button surgery  2019   dr Ezzard Standing   bilteral thumb replacement     both yrs ago per pt on 04-07-2021   BIOPSY  10/16/2019   Procedure: BIOPSY;  Surgeon: Jeani Hawking, MD;  Location: [facility];  Service: Endoscopy;;   COLONOSCOPY WITH PROPOFOL N/A 10/16/2019   Procedure: COLONOSCOPY WITH PROPOFOL;  Surgeon: Jeani Hawking,  MD;  Location: [facility];  Service: Endoscopy;  Laterality: N/A;   CORNEAL TRANSPLANT Bilateral    2007, 2010   DILATION AND CURETTAGE OF UTERUS     yrs ago per pt on 04-06-2021   ENTEROSCOPY N/A 10/16/2019   Procedure: ENTEROSCOPY;  Surgeon: Jeani Hawking, MD;  Location: [facility];  Service: Endoscopy;  Laterality: N/A;   EYE SURGERY Bilateral    both eyes cataracts yrs ago per pt on 04-07-2021   HAND SURGERY  02/2011   INGUINAL HERNIA REPAIR Bilateral 04/10/2021   Procedure: LAPAROSCOPIC BILATERAL INGUINAL HERNIA REPAIR WITH MESH;  Surgeon: Quentin Ore, MD;  Location: [facility];  Service: General;  Laterality: Bilateral;   left carpal tunnel release  Left 2019   at [facility] [place]   right carpal tunnel release     yrs ago per pt on 04-07-2021   right foot morton's neuroma surgery   1990   1980's and 1990's   right tennis elbow surgery     1990's   THYROIDECTOMY, PARTIAL  02/1971   center part removed per pt   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   Procedure: RIGHT TOTAL HIP ARTHROPLASTY ANTERIOR APPROACH;  Surgeon: Durene Romans, MD;  Location: [facility];  Service: Orthopedics;  Laterality: Right;  70 mins   UMBILICAL HERNIA REPAIR  05/2017   dr Ezzard Standing   UPPER GI ENDOSCOPY  2021   and colonscopy @ [facility]  without problems    SOCIAL HISTORY: Social History   Socioeconomic History   Marital status: Married    Spouse name: Not on file   Number of children: Not on file   Years of education: Not on file   Highest education level: Not on file  Occupational History   Not on file  Tobacco Use   Smoking status: Former    Packs/day: 1.00    Years: 30.00    Total pack years: 30.00    Types: Cigarettes    Quit date: 11/07/1992    Years since quitting: 29.3   Smokeless tobacco: Never  Vaping Use   Vaping Use: Never used  Substance and Sexual Activity   Alcohol use: Yes    Alcohol/week: 1.0 standard drink of alcohol    Types: 1  Glasses of wine per week    Comment: wine on occasion  2 or 3 x week   Drug use: No   Sexual activity: Yes    Birth control/protection: None  Other Topics Concern   Not on file  Social History Narrative   Not on file   Social Determinants of Health   Financial Resource Strain: Not on file  Food Insecurity: Not on file  Transportation Needs: Not on file  Physical Activity: Not on file  Stress: Not on file  Social Connections: Not on file  Intimate Partner Violence: Not on file    FAMILY HISTORY: Family History  Problem Relation Age of Onset   Diabetes Sister    Hypertension Sister    Breast cancer Neg Hx     ALLERGIES:  is allergic to ibuprofen, amlodipine, fluvastatin, meloxicam, and nsaids.  MEDICATIONS:  Current Outpatient Medications  Medication Sig Dispense Refill   acetaminophen (TYLENOL) 500 MG tablet Take 1,000 mg by mouth every 6 (six) hours as needed for moderate pain.     albuterol (VENTOLIN HFA) 108 (90 Base) MCG/ACT inhaler Inhale 2 puffs into the lungs every 6 (six) hours as needed for wheezing or shortness of breath. 8 g 6   alum & mag hydroxide-simeth (MAALOX/MYLANTA) 200-200-20 MG/5ML suspension Take 5 mLs by mouth every 6 (six) hours as needed for indigestion or heartburn.     aspirin 81 MG EC tablet Take by mouth.     cetirizine (ZYRTEC) 10 MG tablet Take 10 mg by mouth daily as needed for allergies.     Cetirizine HCl 10 MG CAPS Take by mouth.     clobetasol (TEMOVATE) 0.05 % external solution clobetasol 0.05 % scalp solution  APPLY TO AFFECTED AREA TWICE A DAY AS NEEDED (NOT TO FACE, GROIN, AXILLA)     Cyanocobalamin 2500 MCG SUBL 2,500 mcg.     denosumab (PROLIA) 60 MG/ML SOSY injection Inject 60 mg into the skin every 6 (six) months. Scheduled 10/20/2019     hydrocortisone cream 1 % Apply 1 application topically daily as needed for itching.     levothyroxine (SYNTHROID) 75 MCG tablet Take 75 mcg by mouth daily before breakfast. Takes 1/2 tab of 75 mcg  on Sunday only and 75 mcg every other day     metFORMIN (GLUCOPHAGE-XR) 500 MG 24 hr tablet Take 500 mg by mouth 2 (two) times daily after a meal.     metFORMIN (GLUCOPHAGE-XR) 500 MG 24 hr tablet Take 1 tablet by mouth 2 (two) times daily.     rosuvastatin (CRESTOR) 20 MG tablet Take 20 mg by mouth daily after supper.     sertraline (ZOLOFT) 50 MG tablet Take 50 mg by mouth daily after breakfast.     UNABLE TO FIND Med Name vitamin b 12  2, 500 mcg sl daily     UNABLE TO FIND Med Name: childrens multi vitamin gummies  taking 2 po daily     VITAMIN D PO Take 100 mg by mouth 2 (two) times daily.     telmisartan (MICARDIS) 20 MG tablet Take 20 mg by mouth daily. (Patient not taking: Reported on 03/12/2022)     No current facility-administered medications for this visit.    REVIEW OF SYSTEMS:   Constitutional: ( - ) fevers, ( - )  chills , ( - ) night sweats Eyes: ( - ) blurriness of vision, ( - ) double vision, ( - ) watery eyes Ears, nose, mouth, throat, and face: ( - ) mucositis, ( - ) sore throat Respiratory: ( - ) cough, ( - ) dyspnea, ( - ) wheezes Cardiovascular: ( - ) palpitation, ( - ) chest discomfort, ( - ) lower extremity swelling Gastrointestinal:  ( - ) nausea, ( - ) heartburn, ( - ) change in bowel habits Skin: ( - ) abnormal skin rashes Lymphatics: ( - ) new lymphadenopathy, ( - ) easy bruising Neurological: ( - ) numbness, ( - )  tingling, ( - ) new weaknesses Behavioral/Psych: ( - ) mood change, ( - ) new changes  All other systems were reviewed with the patient and are negative.  PHYSICAL EXAMINATION:  Vitals:   03/12/22 1348  BP: (!) 157/85  Pulse: 75  Resp: 15  Temp: 97.7 F (36.5 C)  SpO2: 94%   Filed Weights   03/12/22 1348  Weight: 144 lb 4.8 oz (65.5 kg)    GENERAL: well appearing elderly Caucasian female in NAD  SKIN: skin color, texture, turgor are normal, no rashes or significant lesions EYES: conjunctiva are pink and non-injected, sclera  clear LUNGS: clear to auscultation and percussion with normal breathing effort HEART: regular rate & rhythm and no murmurs and no lower extremity edema Musculoskeletal: no cyanosis of digits and no clubbing  PSYCH: alert & oriented x 3, fluent speech NEURO: no focal motor/sensory deficits  LABORATORY DATA:  I have reviewed the data as listed    Latest Ref Rng & Units 03/12/2022    3:32 PM 04/10/2021    1:09 PM 09/19/2017    5:44 AM  CBC  WBC 4.0 - 10.5 K/uL 6.3   9.5   Hemoglobin 12.0 - 15.0 g/dL 10.5  13.3  9.4   Hematocrit 36.0 - 46.0 % 35.2  39.0  29.7   Platelets 150 - 400 K/uL 350   261        Latest Ref Rng & Units 03/12/2022    3:32 PM 04/10/2021    1:09 PM 09/19/2017    5:44 AM  CMP  Glucose 70 - 99 mg/dL 89  87  94   BUN 8 - 23 mg/dL $Remove'16  14  11   '[id number]$ Creatinine 0.44 - 1.00 mg/dL 0.83  1.10  0.68   Sodium 135 - 145 mmol/L 141  141  138   Potassium 3.5 - 5.1 mmol/L 4.0  4.0  4.3   Chloride 98 - 111 mmol/L 104  104  107   CO2 22 - 32 mmol/L 30   24   Calcium 8.9 - 10.3 mg/dL 9.4   8.1   Total Protein 6.5 - 8.1 g/dL 7.1     Total Bilirubin 0.3 - 1.2 mg/dL 0.4     Alkaline Phos 38 - 126 U/L 51     AST 15 - 41 U/L 25     ALT 0 - 44 U/L 13        ASSESSMENT & PLAN Treniyah Lynn Kiernan 81 y.o. female with medical history significant for GERD, anxiety, arthritis, depression, migraines, HLD, hypothryoidism, hypertension who presented for evaluation of anemia.   After review of the labs, review of the records, and discussion with the patient the patients findings are most consistent with normocytic anemia of unclear etiology.   # Normocytic Anemia  -- will order nutritional labs to include vitamin b12 ,MMA, folate, Iron panel, and ferritin -- r/o MM with SPEP and SFLC -- will also order Retic panel and LDH -- if patient is found to be iron deficient will proceed with IV iron therapy. --RTC pending results of the above studies.   Orders Placed This Encounter   Procedures   CBC with Differential ([place] Only)    Standing Status:   Future    Number of Occurrences:   1    Standing Expiration Date:   03/13/2023   CMP ([place] only)    Standing Status:   Future    Number of Occurrences:   1  Standing Expiration Date:   03/13/2023   Ferritin    Standing Status:   Future    Number of Occurrences:   1    Standing Expiration Date:   03/13/2023   Iron and Iron Binding Capacity ([facility] only)    Standing Status:   Future    Number of Occurrences:   1    Standing Expiration Date:   03/13/2023   Lactate dehydrogenase (LDH)    Standing Status:   Future    Number of Occurrences:   1    Standing Expiration Date:   03/12/2023   Retic Panel    Standing Status:   Future    Number of Occurrences:   1    Standing Expiration Date:   03/13/2023   Vitamin B12    Standing Status:   Future    Number of Occurrences:   1    Standing Expiration Date:   03/12/2023   Methylmalonic acid, serum    Standing Status:   Future    Number of Occurrences:   1    Standing Expiration Date:   03/12/2023   Folate, Serum    Standing Status:   Future    Number of Occurrences:   1    Standing Expiration Date:   03/12/2023   Multiple Myeloma Panel (SPEP&IFE w/QIG)    Standing Status:   Future    Number of Occurrences:   1    Standing Expiration Date:   03/12/2023   Kappa/lambda light chains    Standing Status:   Future    Number of Occurrences:   1    Standing Expiration Date:   03/12/2023    All questions were answered. The patient knows to call the clinic with any problems, questions or concerns.  A total of more than 60 minutes were spent on this encounter with face-to-face time and non-face-to-face time, including preparing to see the patient, ordering tests and/or medications, counseling the patient and coordination of care as outlined above.   Ledell Peoples, MD Department of Hematology/Oncology [place] [facility] Phone: [phone] Pager: [phone] Email: [email or web address]  03/12/2022 4:12 PM

## 2022-03-13 ENCOUNTER — Other Ambulatory Visit: Payer: Self-pay | Admitting: Hematology and Oncology

## 2022-03-13 ENCOUNTER — Telehealth: Payer: Self-pay | Admitting: Pharmacy Technician

## 2022-03-13 ENCOUNTER — Telehealth: Payer: Self-pay | Admitting: Hematology and Oncology

## 2022-03-13 ENCOUNTER — Other Ambulatory Visit: Payer: Self-pay | Admitting: Pharmacy Technician

## 2022-03-13 DIAGNOSIS — D500 Iron deficiency anemia secondary to blood loss (chronic): Secondary | ICD-10-CM | POA: Insufficient documentation

## 2022-03-13 LAB — KAPPA/LAMBDA LIGHT CHAINS
Kappa free light chain: 26 mg/L — ABNORMAL HIGH (ref 3.3–19.4)
Kappa, lambda light chain ratio: 1.41 (ref 0.26–1.65)
Lambda free light chains: 18.4 mg/L (ref 5.7–26.3)

## 2022-03-13 LAB — FERRITIN: Ferritin: 6 ng/mL — ABNORMAL LOW (ref 11–307)

## 2022-03-13 NOTE — Telephone Encounter (Addendum)
Dr. Lorenso Courier, Sandra Harvey note:  Monoferric is non preferred medication for [employer] and will likely be denied due to patient has not tried and or failed step therapy. Preferred medications are venofer? Would you like to try venofer? Rep: Tiffany-L 03/13/22 2:21

## 2022-03-13 NOTE — Telephone Encounter (Signed)
Per 10/23 los called and left message for pt about appointment  

## 2022-03-14 LAB — METHYLMALONIC ACID, SERUM: Methylmalonic Acid, Quantitative: 223 nmol/L (ref 0–378)

## 2022-03-15 LAB — MULTIPLE MYELOMA PANEL, SERUM
Albumin SerPl Elph-Mcnc: 3.8 g/dL (ref 2.9–4.4)
Albumin/Glob SerPl: 1.5 (ref 0.7–1.7)
Alpha 1: 0.3 g/dL (ref 0.0–0.4)
Alpha2 Glob SerPl Elph-Mcnc: 0.8 g/dL (ref 0.4–1.0)
B-Globulin SerPl Elph-Mcnc: 1 g/dL (ref 0.7–1.3)
Gamma Glob SerPl Elph-Mcnc: 0.7 g/dL (ref 0.4–1.8)
Globulin, Total: 2.7 g/dL (ref 2.2–3.9)
IgA: 179 mg/dL (ref 64–422)
IgG (Immunoglobin G), Serum: 617 mg/dL (ref 586–1602)
IgM (Immunoglobulin M), Srm: 265 mg/dL — ABNORMAL HIGH (ref 26–217)
Total Protein ELP: 6.5 g/dL (ref 6.0–8.5)

## 2022-03-16 ENCOUNTER — Ambulatory Visit (INDEPENDENT_AMBULATORY_CARE_PROVIDER_SITE_OTHER): Payer: Medicare HMO

## 2022-03-16 ENCOUNTER — Telehealth: Payer: Self-pay | Admitting: *Deleted

## 2022-03-16 VITALS — BP 174/67 | HR 67 | Temp 98.1°F | Resp 18 | Ht 59.5 in | Wt 144.8 lb

## 2022-03-16 DIAGNOSIS — D500 Iron deficiency anemia secondary to blood loss (chronic): Secondary | ICD-10-CM

## 2022-03-16 DIAGNOSIS — D509 Iron deficiency anemia, unspecified: Secondary | ICD-10-CM | POA: Diagnosis not present

## 2022-03-16 MED ORDER — SODIUM CHLORIDE 0.9 % IV SOLN
200.0000 mg | Freq: Once | INTRAVENOUS | Status: AC
  Administered 2022-03-16: 200 mg via INTRAVENOUS
  Filled 2022-03-16: qty 10

## 2022-03-16 NOTE — Progress Notes (Signed)
Diagnosis: Iron Deficiency Anemia  Provider:  Marshell Garfinkel MD  Procedure: Infusion  IV Type: Peripheral, IV Location: R Antecubital  Venofer (Iron Sucrose), Dose: 200 mg  Infusion Start Time: 9811  Infusion Stop Time: 1330  Post Infusion IV Care: Observation period completed and Peripheral IV Discontinued  Discharge: Condition: Good, Destination: Home . AVS provided to patient.   Performed by:  Adelina Mings, LPN

## 2022-03-16 NOTE — Telephone Encounter (Signed)
TCT patient regarding recent lab results.Spoke with her. Advised that she is severely iron deficient. Dr. Anastasia Fiedler has ordered IV iron. Pt voiced understanding. She actually had her 1 st iron infusion today. She states she feels well.  She is aware of her upcoming appts.

## 2022-03-16 NOTE — Telephone Encounter (Signed)
-----   Message from Orson Slick, MD sent at 03/13/2022  1:51 PM EDT ----- Please let Sandra Harvey know that her labs findings are most consistent with iron deficiency anemia.  Because she is unable to tolerate p.o. iron therapy I have scheduled IV iron therapy for her.  The request has been put in to [employer].  If she has not from then by Friday please have her reach out to [place].  ----- Message ----- From: Interface, Lab In [place] Sent: 03/12/2022   3:50 PM EDT To: Orson Slick, MD

## 2022-03-23 ENCOUNTER — Ambulatory Visit (INDEPENDENT_AMBULATORY_CARE_PROVIDER_SITE_OTHER): Payer: Medicare HMO

## 2022-03-23 VITALS — BP 168/74 | HR 67 | Temp 98.4°F | Resp 16 | Ht 59.0 in | Wt 143.4 lb

## 2022-03-23 DIAGNOSIS — D500 Iron deficiency anemia secondary to blood loss (chronic): Secondary | ICD-10-CM | POA: Diagnosis not present

## 2022-03-23 MED ORDER — SODIUM CHLORIDE 0.9 % IV SOLN
200.0000 mg | Freq: Once | INTRAVENOUS | Status: AC
  Administered 2022-03-23: 200 mg via INTRAVENOUS
  Filled 2022-03-23: qty 10

## 2022-03-23 NOTE — Progress Notes (Signed)
Diagnosis: Iron Deficiency Anemia  Provider:  Marshell Garfinkel MD  Procedure: Infusion  IV Type: Peripheral, IV Location: R Antecubital  Venofer (Iron Sucrose), Dose: 200 mg  Infusion Start Time: 7672  Infusion Stop Time: 1410  Post Infusion IV Care: Peripheral IV Discontinued  Discharge: Condition: Good, Destination: Home . AVS provided to patient.   Performed by:  Koren Shiver, RN

## 2022-03-29 DIAGNOSIS — S0990XA Unspecified injury of head, initial encounter: Secondary | ICD-10-CM | POA: Diagnosis not present

## 2022-03-29 DIAGNOSIS — I700 Atherosclerosis of aorta: Secondary | ICD-10-CM | POA: Diagnosis not present

## 2022-03-29 DIAGNOSIS — Z23 Encounter for immunization: Secondary | ICD-10-CM | POA: Diagnosis not present

## 2022-03-29 DIAGNOSIS — D509 Iron deficiency anemia, unspecified: Secondary | ICD-10-CM | POA: Diagnosis not present

## 2022-03-30 ENCOUNTER — Ambulatory Visit (INDEPENDENT_AMBULATORY_CARE_PROVIDER_SITE_OTHER): Payer: Medicare HMO | Admitting: *Deleted

## 2022-03-30 VITALS — BP 198/67 | HR 74 | Temp 97.7°F | Resp 16 | Ht 59.0 in | Wt 143.8 lb

## 2022-03-30 DIAGNOSIS — D500 Iron deficiency anemia secondary to blood loss (chronic): Secondary | ICD-10-CM | POA: Diagnosis not present

## 2022-03-30 MED ORDER — SODIUM CHLORIDE 0.9 % IV SOLN
200.0000 mg | Freq: Once | INTRAVENOUS | Status: AC
  Administered 2022-03-30: 200 mg via INTRAVENOUS
  Filled 2022-03-30: qty 10

## 2022-03-30 NOTE — Progress Notes (Signed)
Diagnosis: Iron Deficiency Anemia  Provider:  Chilton Greathouse MD  Procedure: Infusion  IV Type: Peripheral, IV Location: R Antecubital  Venofer (Iron Sucrose), Dose: 200 mg  Infusion Start Time: 1304 pm  Infusion Stop Time: 1320 pm  Post Infusion IV Care: Observation period completed and Peripheral IV Discontinued  Discharge: Condition: Good, Destination: Home . AVS provided to patient.   Performed by:  Forrest Moron, RN

## 2022-03-31 IMAGING — CT CT ABD-PELV W/O CM
2 of 3 series · 13 of 32 positions shown, 19 images · non-contrast
Comparison: None

CLINICAL DATA: Painful knot RIGHT lower quadrant, RIGHT groin pain
with sitting or in standing, 6 week history, prior umbilical hernia
repair as infant

EXAM:
CT ABDOMEN AND PELVIS WITHOUT CONTRAST
TECHNIQUE: Multidetector CT imaging of the abdomen and pelvis was performed
following the standard protocol without IV contrast. Sagittal and
coronal MPR images reconstructed from axial data set. Dilute oral
contrast administered for exam

[Series 3: abd/pelvis w/(date) · axial · 0.83mm/px · z∈[-392,+3]mm · 10 of 97 slices shown, 16 images]
[im 9/97  soft-tissue]
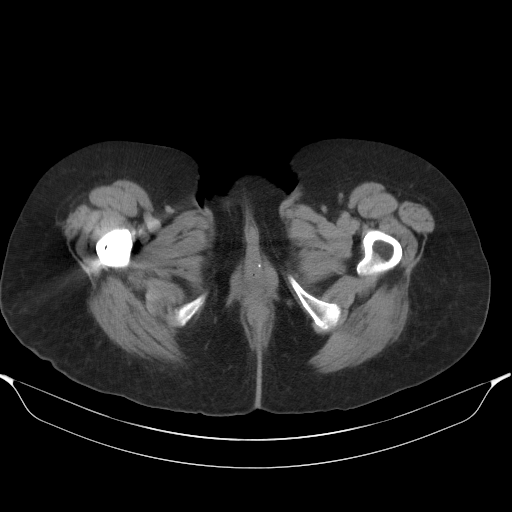
[im 9/97  bone]
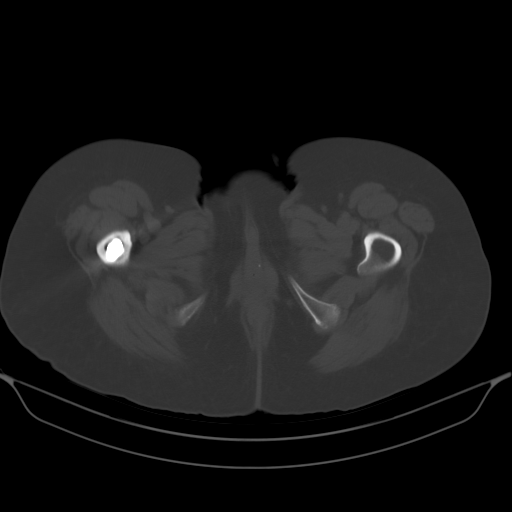
[im 18/97  soft-tissue]
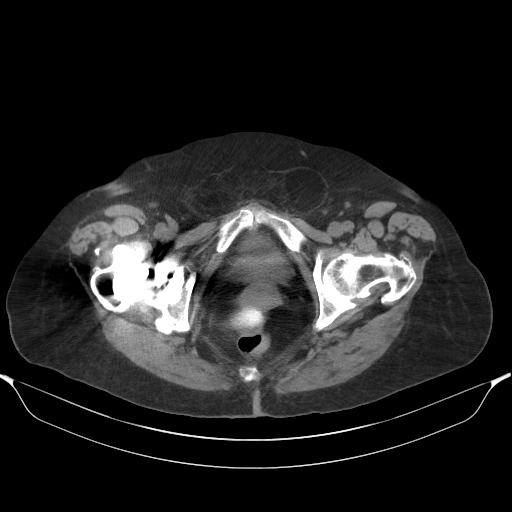
[im 27/97  soft-tissue]
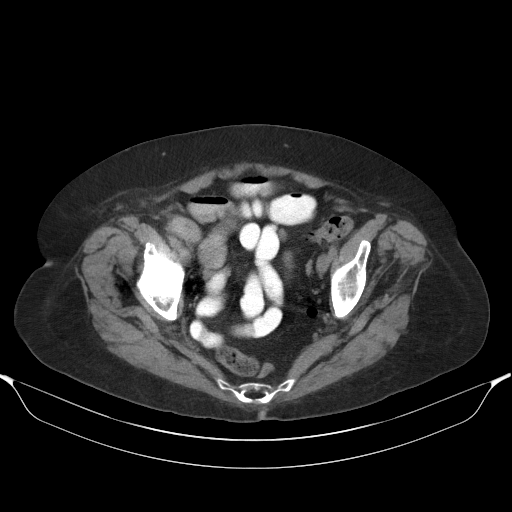
[im 35/97  soft-tissue]
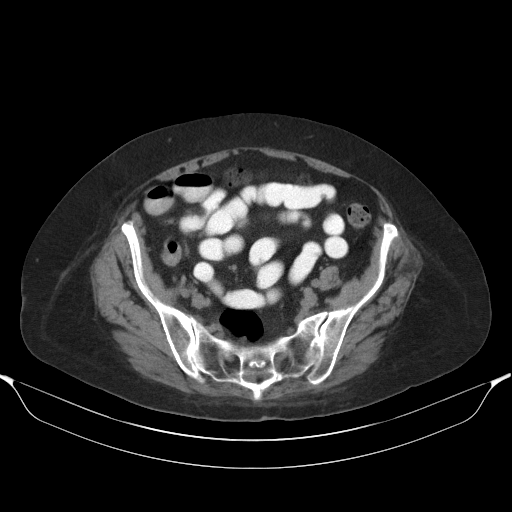
[im 44/97  soft-tissue]
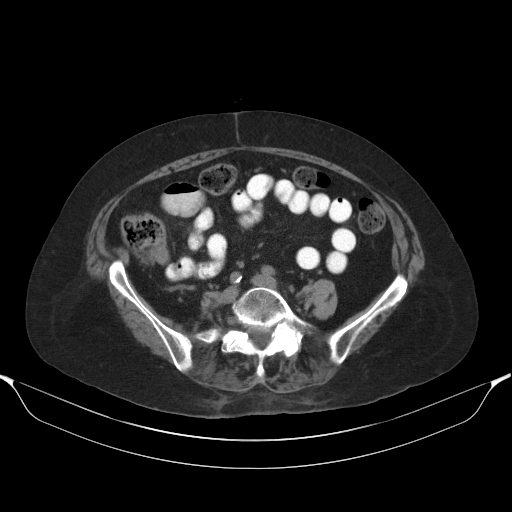
[im 53/97  soft-tissue]
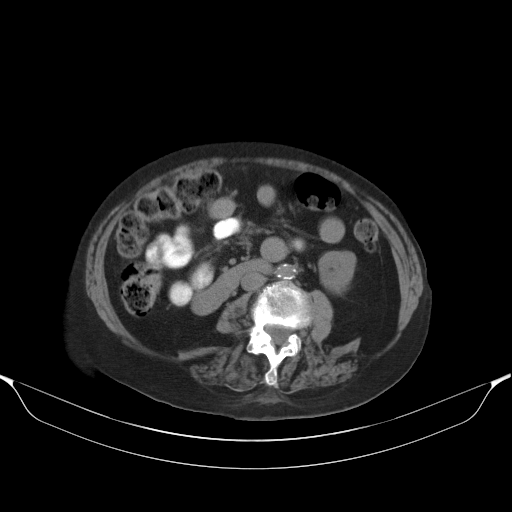
[im 62/97  soft-tissue]
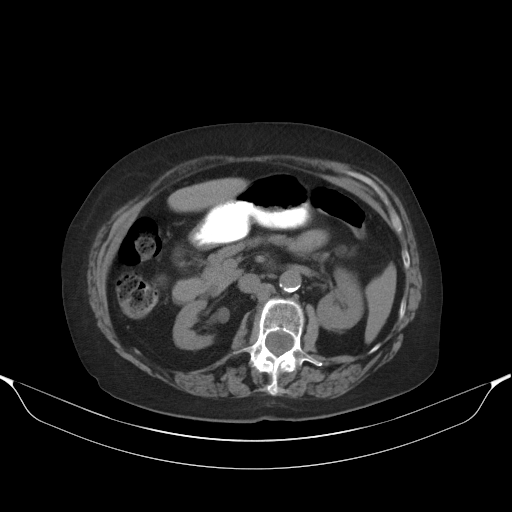
[im 62/97  lung]
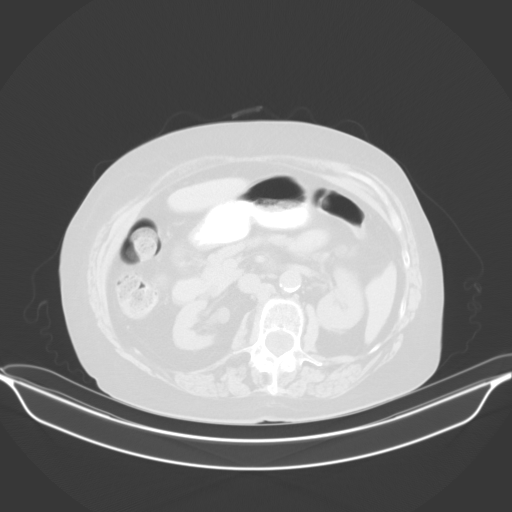
[im 70/97  soft-tissue]
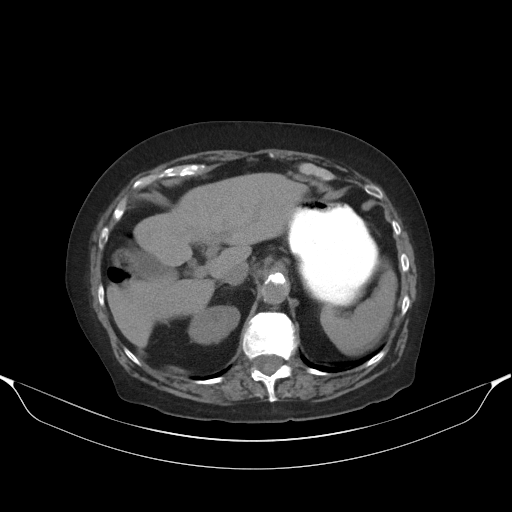
[im 70/97  lung]
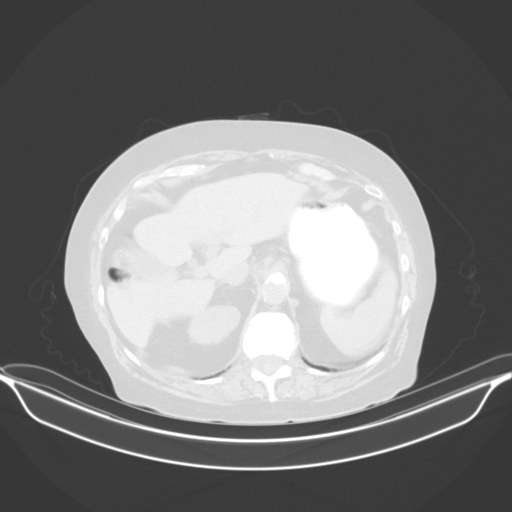
[im 79/97  soft-tissue]
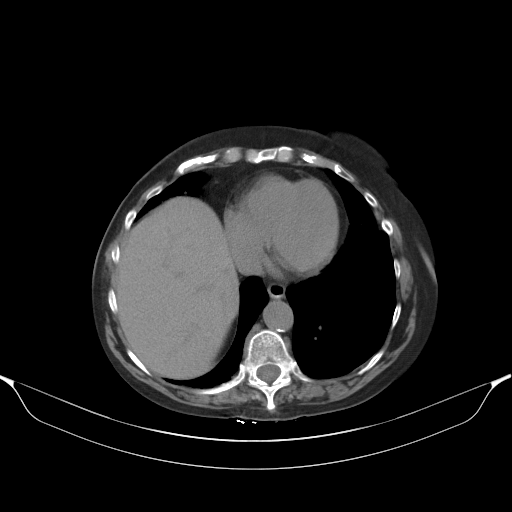
[im 79/97  lung]
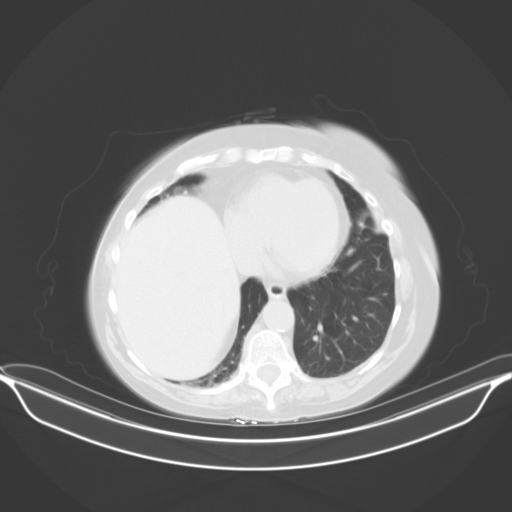
[im 79/97  bone]
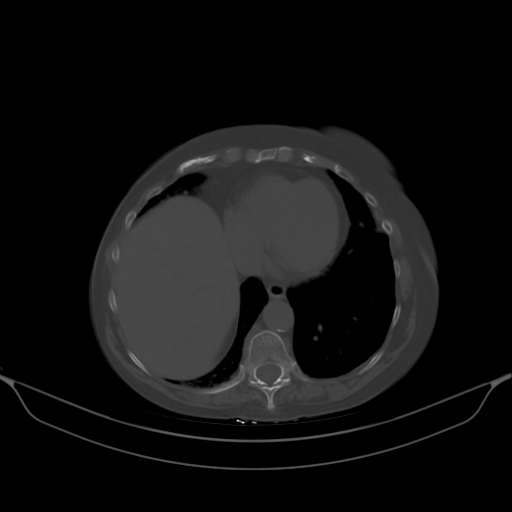
[im 88/97  soft-tissue]
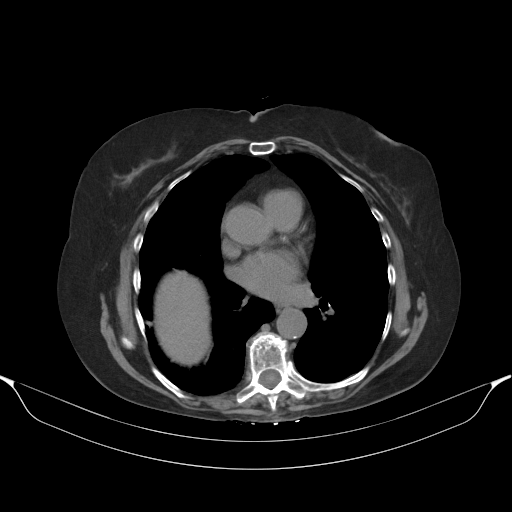
[im 88/97  lung]
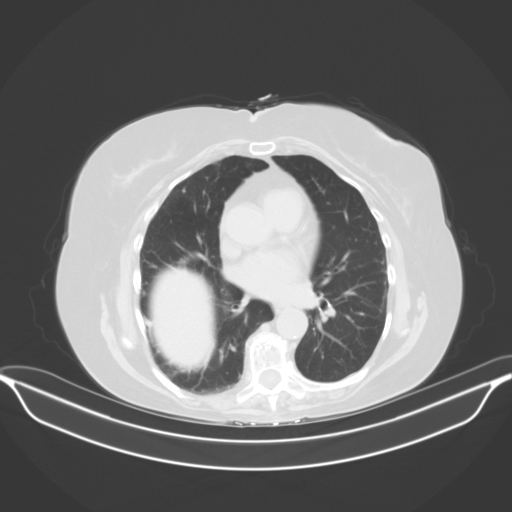

[Series 6: lung · axial · 0.83mm/px · z∈[-84,-46]mm · 3 of 76 slices shown]
[im 10/76  bone]
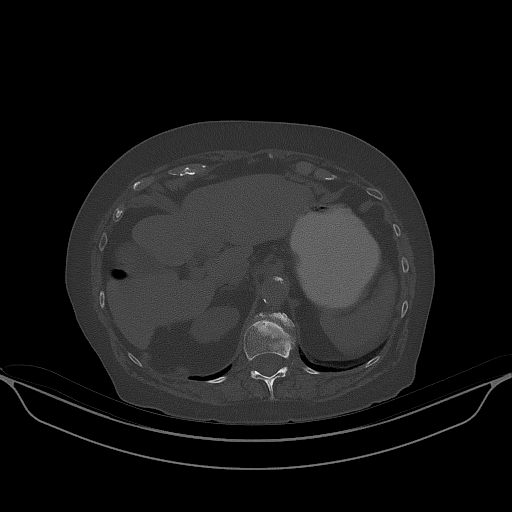
[im 19/76  bone]
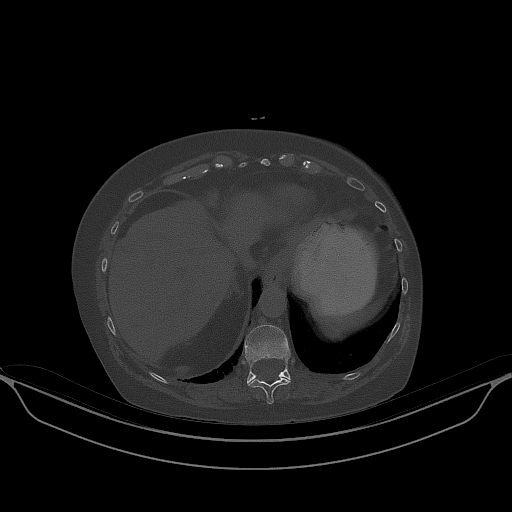
[im 29/76  bone]
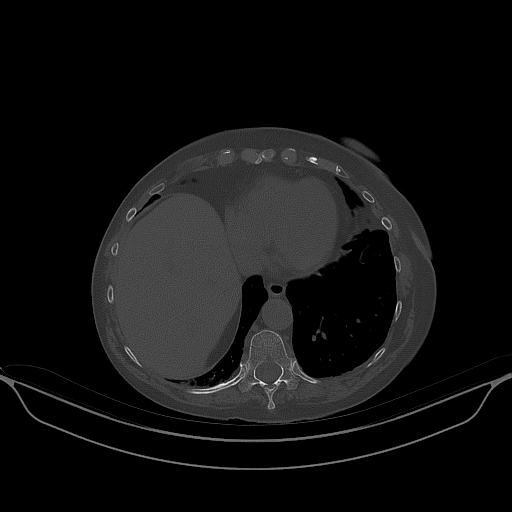

[13 of 32 positions shown; findings below may reference images not displayed]

FINDINGS: Lower chest: Subsegmental atelectasis at lung bases bilaterally

Hepatobiliary: Gallbladder and liver normal appearance

Pancreas: Normal appearance

Spleen: Calcified granuloma within spleen. Spleen otherwise normal
appearance.

Adrenals/Urinary Tract: Adrenal glands normal. Kidneys, ureters, and
bladder normal appearance.

Stomach/Bowel: Normal appendix. Stomach and bowel loops grossly
unremarkable within limitations of mild respiratory motion.

Vascular/Lymphatic: Atherosclerotic calcifications aorta and iliac
arteries without aneurysm. No adenopathy.

Reproductive: Uterus surgically absent.  Atrophic ovaries.

Other: No free air or free fluid. BILATERAL inguinal hernias
containing fat.

Musculoskeletal: Osseous demineralization with beam hardening
artifacts secondary to RIGHT hip prosthesis. Degenerative changes
and levoconvex scoliosis lumbar spine.
IMPRESSION: BILATERAL inguinal hernias containing fat.

No acute intra-abdominal or intrapelvic abnormalities.

Aortic Atherosclerosis ([id number]).

## 2022-04-05 ENCOUNTER — Ambulatory Visit (INDEPENDENT_AMBULATORY_CARE_PROVIDER_SITE_OTHER): Payer: Medicare HMO

## 2022-04-05 VITALS — BP 162/74 | HR 71 | Temp 98.6°F | Resp 18 | Ht 59.0 in | Wt 144.8 lb

## 2022-04-05 DIAGNOSIS — D500 Iron deficiency anemia secondary to blood loss (chronic): Secondary | ICD-10-CM

## 2022-04-05 MED ORDER — SODIUM CHLORIDE 0.9 % IV SOLN
200.0000 mg | Freq: Once | INTRAVENOUS | Status: AC
  Administered 2022-04-05: 200 mg via INTRAVENOUS
  Filled 2022-04-05: qty 10

## 2022-04-05 NOTE — Progress Notes (Signed)
Diagnosis: Iron Deficiency Anemia  Provider:  Chilton Greathouse MD  Procedure: Infusion  IV Type: Peripheral, IV Location: R Antecubital  Venofer (Iron Sucrose), Dose: 200 mg  Infusion Start Time: 1312  Infusion Stop Time: 1332  Post Infusion IV Care: Peripheral IV Discontinued  Discharge: Condition: Good, Destination: Home . AVS provided to patient.   Performed by:  Adriana Mccallum, RN

## 2022-04-11 ENCOUNTER — Ambulatory Visit: Payer: Medicare HMO

## 2022-04-13 ENCOUNTER — Other Ambulatory Visit: Payer: Self-pay | Admitting: *Deleted

## 2022-04-13 DIAGNOSIS — R0602 Shortness of breath: Secondary | ICD-10-CM

## 2022-04-16 ENCOUNTER — Encounter: Payer: Self-pay | Admitting: Pulmonary Disease

## 2022-04-16 ENCOUNTER — Ambulatory Visit (INDEPENDENT_AMBULATORY_CARE_PROVIDER_SITE_OTHER): Payer: Medicare HMO | Admitting: Pulmonary Disease

## 2022-04-16 ENCOUNTER — Ambulatory Visit: Payer: Medicare HMO | Admitting: Pulmonary Disease

## 2022-04-16 ENCOUNTER — Ambulatory Visit (INDEPENDENT_AMBULATORY_CARE_PROVIDER_SITE_OTHER): Payer: Medicare HMO

## 2022-04-16 VITALS — BP 140/80 | HR 62 | Temp 98.0°F | Ht 60.0 in | Wt 143.2 lb

## 2022-04-16 VITALS — BP 188/65 | HR 71 | Temp 97.9°F | Resp 16 | Ht 60.0 in | Wt 143.2 lb

## 2022-04-16 DIAGNOSIS — D500 Iron deficiency anemia secondary to blood loss (chronic): Secondary | ICD-10-CM | POA: Diagnosis not present

## 2022-04-16 DIAGNOSIS — R0602 Shortness of breath: Secondary | ICD-10-CM

## 2022-04-16 LAB — PULMONARY FUNCTION TEST
DL/VA % pred: 118 %
DL/VA: 4.94 ml/min/mmHg/L
DLCO cor % pred: 61 %
DLCO cor: 10.05 ml/min/mmHg
DLCO unc % pred: 55 %
DLCO unc: 9.03 ml/min/mmHg
FEF 25-75 Post: 1.93 L/sec
FEF 25-75 Pre: 1.32 L/sec
FEF2575-%Change-Post: 46 %
FEF2575-%Pred-Post: 178 %
FEF2575-%Pred-Pre: 121 %
FEV1-%Change-Post: 6 %
FEV1-%Pred-Post: 76 %
FEV1-%Pred-Pre: 71 %
FEV1-Post: 1.15 L
FEV1-Pre: 1.08 L
FEV1FVC-%Change-Post: 3 %
FEV1FVC-%Pred-Pre: 118 %
FEV6-%Change-Post: 3 %
FEV6-%Pred-Post: 66 %
FEV6-%Pred-Pre: 64 %
FEV6-Post: 1.27 L
FEV6-Pre: 1.23 L
FEV6FVC-%Pred-Post: 106 %
FEV6FVC-%Pred-Pre: 106 %
FVC-%Change-Post: 3 %
FVC-%Pred-Post: 62 %
FVC-%Pred-Pre: 60 %
FVC-Post: 1.27 L
FVC-Pre: 1.23 L
Post FEV1/FVC ratio: 90 %
Post FEV6/FVC ratio: 100 %
Pre FEV1/FVC ratio: 87 %
Pre FEV6/FVC Ratio: 100 %
RV % pred: 100 %
RV: 2.24 L
TLC % pred: 89 %
TLC: 3.98 L

## 2022-04-16 MED ORDER — SODIUM CHLORIDE 0.9 % IV SOLN
200.0000 mg | Freq: Once | INTRAVENOUS | Status: AC
  Administered 2022-04-16: 200 mg via INTRAVENOUS
  Filled 2022-04-16: qty 10

## 2022-04-16 NOTE — Progress Notes (Signed)
Sandra Harvey    [id number]    [birth year]  Primary Care Physician:Pharr, Zollie Beckers, MD  Referring Physician: Merri Brunette, MD [address] [place],  [place] [address]  Chief complaint:   Shortness of breath on exertion  HPI:  Shortness of breath is a little better since the last visit  Trying to get more active  Has been feeling relatively well Trying to stay active Denies any recent respiratory tract infection   Medication changes was associated with fatigue and low blood pressure and medications are to be changed again  Quit smoking in 1993 was smoking up to 2 to 3 packs a day  Was never diagnosed with any lung disease  She was able to function well into the last year, taking care of her own yard, leaf blowing -Has found this difficult this year Sometimes just bending over to pick something up can lead to shortness of breath  She has no wheezing Denies any significant chest tightness She has had decreasing activity level  No weight loss  Decreased appetite  Outpatient Encounter Medications as of 04/16/2022  Medication Sig   acetaminophen (TYLENOL) 500 MG tablet Take 1,000 mg by mouth every 6 (six) hours as needed for moderate pain.   albuterol (VENTOLIN HFA) 108 (90 Base) MCG/ACT inhaler Inhale 2 puffs into the lungs every 6 (six) hours as needed for wheezing or shortness of breath.   alum & mag hydroxide-simeth (MAALOX/MYLANTA) 200-200-20 MG/5ML suspension Take 5 mLs by mouth every 6 (six) hours as needed for indigestion or heartburn.   aspirin 81 MG EC tablet Take by mouth.   cetirizine (ZYRTEC) 10 MG tablet Take 10 mg by mouth daily as needed for allergies.   clobetasol (TEMOVATE) 0.05 % external solution clobetasol 0.05 % scalp solution  APPLY TO AFFECTED AREA TWICE A DAY AS NEEDED (NOT TO FACE, GROIN, AXILLA)   Cyanocobalamin 2500 MCG SUBL 2,500 mcg.   denosumab (PROLIA) 60 MG/ML SOSY injection Inject 60 mg into the skin  every 6 (six) months. Scheduled 10/20/2019   hydrocortisone cream 1 % Apply 1 application topically daily as needed for itching.   levothyroxine (SYNTHROID) 75 MCG tablet Take 75 mcg by mouth daily before breakfast. Takes 1/2 tab of 75 mcg on Sunday only and 75 mcg every other day   metFORMIN (GLUCOPHAGE-XR) 500 MG 24 hr tablet Take 500 mg by mouth 2 (two) times daily after a meal.   metFORMIN (GLUCOPHAGE-XR) 500 MG 24 hr tablet Take 1 tablet by mouth 2 (two) times daily.   rosuvastatin (CRESTOR) 20 MG tablet Take 20 mg by mouth daily after supper.   sertraline (ZOLOFT) 50 MG tablet Take 50 mg by mouth daily after breakfast.   UNABLE TO FIND Med Name vitamin b 12  2, 500 mcg sl daily   UNABLE TO FIND Med Name: childrens multi vitamin gummies  taking 2 po daily   VITAMIN D PO Take 100 mg by mouth 2 (two) times daily.   Cetirizine HCl 10 MG CAPS Take by mouth. (Patient not taking: Reported on 04/16/2022)   telmisartan (MICARDIS) 20 MG tablet Take 20 mg by mouth daily. (Patient not taking: Reported on 04/16/2022)   Facility-Administered Encounter Medications as of 04/16/2022  Medication   iron sucrose (VENOFER) 200 mg in sodium chloride 0.9 % 100 mL IVPB    Allergies as of 04/16/2022 - Review Complete 04/16/2022  Allergen Reaction Noted   Ibuprofen Nausea Only 10/08/2011  Amlodipine  11/20/2021   Fluvastatin  11/20/2021   Meloxicam  09/11/2017   Nsaids Nausea And Vomiting 07/09/2011    Past Medical History:  Diagnosis Date   Allergy    Anxiety    Arthritis    oa   Bilateral inguinal hernia 04/06/2021   Cataract    Complication of anesthesia    slow to wake up  after 2019 colonscopy at dr hung office   Depression    Dry eye syndrome    both eyes   Fuchs' corneal dystrophy    GERD (gastroesophageal reflux disease)    GI bleed 2021   Headache    History of migraine headaches    none recne per pt on 04-06-2021   History of osteopenia    Hypercholesterolemia    Hyperlipidemia     Hypertension    off bp meds 6 months due to low bp   Hypothyroidism    Inversion, nipple    right   Stomach ulcer    diagnosed 08/2017    Type 2 DM    does not check cbg, last hemaglobin 04-04-2021 was 5.8 on 04-04-2021 at dr pharr office    Past Surgical History:  Procedure Laterality Date   belly button surgery  2019   dr Ezzard Standing   bilteral thumb replacement     both yrs ago per pt on 04-07-2021   BIOPSY  10/16/2019   Procedure: BIOPSY;  Surgeon: Jeani Hawking, MD;  Location: [facility];  Service: Endoscopy;;   COLONOSCOPY WITH PROPOFOL N/A 10/16/2019   Procedure: COLONOSCOPY WITH PROPOFOL;  Surgeon: Jeani Hawking, MD;  Location: [facility];  Service: Endoscopy;  Laterality: N/A;   CORNEAL TRANSPLANT Bilateral    2007, 2010   DILATION AND CURETTAGE OF UTERUS     yrs ago per pt on 04-06-2021   ENTEROSCOPY N/A 10/16/2019   Procedure: ENTEROSCOPY;  Surgeon: Jeani Hawking, MD;  Location: [facility];  Service: Endoscopy;  Laterality: N/A;   EYE SURGERY Bilateral    both eyes cataracts yrs ago per pt on 04-07-2021   HAND SURGERY  02/2011   INGUINAL HERNIA REPAIR Bilateral 04/10/2021   Procedure: LAPAROSCOPIC BILATERAL INGUINAL HERNIA REPAIR WITH MESH;  Surgeon: Quentin Ore, MD;  Location: [facility];  Service: General;  Laterality: Bilateral;   left carpal tunnel release  Left 2019   at [facility] [place]   right carpal tunnel release     yrs ago per pt on 04-07-2021   right foot morton's neuroma surgery   1990   1980's and 1990's   right tennis elbow surgery     1990's   THYROIDECTOMY, PARTIAL  02/1971   center part removed per pt   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   Procedure: RIGHT TOTAL HIP ARTHROPLASTY ANTERIOR APPROACH;  Surgeon: Durene Romans, MD;  Location: [facility];  Service: Orthopedics;  Laterality: Right;  70 mins   UMBILICAL HERNIA REPAIR  05/2017   dr Ezzard Standing   UPPER GI ENDOSCOPY  2021   and colonscopy @ [facility]   without problems    Family History  Problem Relation Age of Onset   Diabetes Sister    Hypertension Sister    Breast cancer Neg Hx     Social History   Socioeconomic History   Marital status: Married    Spouse name: Not on file   Number of children: Not on file   Years of education: Not on file   Highest education level: Not  on file  Occupational History   Not on file  Tobacco Use   Smoking status: Former    Packs/day: 1.00    Years: 30.00    Total pack years: 30.00    Types: Cigarettes    Quit date: 11/07/1992    Years since quitting: 29.4   Smokeless tobacco: Never  Vaping Use   Vaping Use: Never used  Substance and Sexual Activity   Alcohol use: Yes    Alcohol/week: 1.0 standard drink of alcohol    Types: 1 Glasses of wine per week    Comment: wine on occasion  2 or 3 x week   Drug use: No   Sexual activity: Yes    Birth control/protection: None  Other Topics Concern   Not on file  Social History Narrative   Not on file   Social Determinants of Health   Financial Resource Strain: Not on file  Food Insecurity: Not on file  Transportation Needs: Not on file  Physical Activity: Not on file  Stress: Not on file  Social Connections: Not on file  Intimate Partner Violence: Not on file    Review of Systems  Constitutional:  Negative for fatigue.  Respiratory:  Positive for shortness of breath.     Vitals:   04/16/22 1320  BP: (!) 140/80  Pulse: 62  Temp: 98 F (36.7 C)  SpO2: 95%     Physical Exam Constitutional:      Appearance: Normal appearance.  HENT:     Head: Normocephalic.     Mouth/Throat:     Mouth: Mucous membranes are moist.  Cardiovascular:     Rate and Rhythm: Normal rate and regular rhythm.     Heart sounds: No murmur heard.    No friction rub.  Pulmonary:     Effort: No respiratory distress.     Breath sounds: No stridor. No wheezing or rhonchi.  Musculoskeletal:     Cervical back: No rigidity.  Neurological:     Mental  Status: She is alert.  Psychiatric:        Mood and Affect: Mood normal.    Data Reviewed: Chest x-ray with elevated right hemidiaphragm, no acute infiltrate  An abdominal CT recently did show elevated right hemidiaphragm  Pulmonary function test today likely with combined obstruction and restriction  Assessment:  Shortness of breath with exertion -Symptom improvement  Past history of smoking  Some degree of deconditioning likely contributing   Plan/Recommendations: Albuterol use as needed -Has not really needed it  Encouraged to continue graded activities as tolerated  I will see her back in about 6 months  Encouraged to call with significant concerns  Virl Diamond MD [facility] 04/16/2022, 1:35 PM  CC: Merri Brunette, MD

## 2022-04-16 NOTE — Patient Instructions (Signed)
Full PFT Performed Today  

## 2022-04-16 NOTE — Progress Notes (Signed)
Diagnosis: Iron Deficiency Anemia  Provider:  Chilton Greathouse MD  Procedure: Infusion  IV Type: Peripheral, IV Location: R Antecubital  Venofer (Iron Sucrose), Dose: 200 mg  Infusion Start Time: 1423  Infusion Stop Time: 1440  Post Infusion IV Care: Peripheral IV Discontinued  Discharge: Condition: Good, Destination: Home . AVS provided to patient.   Performed by:  Nat Math, RN

## 2022-04-16 NOTE — Progress Notes (Signed)
Full PFT Performed Today  

## 2022-04-16 NOTE — Patient Instructions (Signed)
Continue graded exercise as tolerated  Albuterol to be used as needed  Your breathing study did show possible combined mild obstruction and mild restriction  Physical activity as tolerated will continue to serve you well  I will see you back in about 6 months

## 2022-04-20 ENCOUNTER — Ambulatory Visit: Payer: Medicare HMO | Admitting: Pulmonary Disease

## 2022-04-24 ENCOUNTER — Ambulatory Visit: Payer: Medicare HMO | Admitting: Podiatry

## 2022-04-24 DIAGNOSIS — E1151 Type 2 diabetes mellitus with diabetic peripheral angiopathy without gangrene: Secondary | ICD-10-CM

## 2022-04-24 DIAGNOSIS — M2141 Flat foot [pes planus] (acquired), right foot: Secondary | ICD-10-CM

## 2022-04-24 DIAGNOSIS — M2041 Other hammer toe(s) (acquired), right foot: Secondary | ICD-10-CM

## 2022-04-24 DIAGNOSIS — R600 Localized edema: Secondary | ICD-10-CM | POA: Diagnosis not present

## 2022-04-24 DIAGNOSIS — M2142 Flat foot [pes planus] (acquired), left foot: Secondary | ICD-10-CM | POA: Diagnosis not present

## 2022-04-24 DIAGNOSIS — M2042 Other hammer toe(s) (acquired), left foot: Secondary | ICD-10-CM | POA: Diagnosis not present

## 2022-04-24 NOTE — Progress Notes (Signed)
Subjective:  Patient ID: Sandra Harvey, female    DOB: 1941-01-08,  MRN: [id number]  Chief Complaint  Patient presents with   Peripheral Neuropathy    Burning and tingling bilateral feet.    Foot Swelling    Left foot swelling    81 y.o. female presents for for concern for bilateral foot burning tingling sensation.  She does have a history of having diabetes in the past she says is well controlled.  She is not taking gabapentin.  She does not really want to take more medication that could make her drowsy as she has had issues with iron deficiency anemia that was causing her to be tired and feel fatigued for almost a year.  She also reports some edema in her left leg that is more recent and acute and wants to get evaluated as well.  Does have some pain in the calf and felt a knot in the calf.  Denies any trouble breathing or shortness of breath.  She is also hoping to get some new custom made orthotics that she wears orthotics and feels that they do help when she is walking.  Past Medical History:  Diagnosis Date   Allergy    Anxiety    Arthritis    oa   Bilateral inguinal hernia 04/06/2021   Cataract    Complication of anesthesia    slow to wake up  after 2019 colonscopy at dr hung office   Depression    Dry eye syndrome    both eyes   Fuchs' corneal dystrophy    GERD (gastroesophageal reflux disease)    GI bleed 2021   Headache    History of migraine headaches    none recne per pt on 04-06-2021   History of osteopenia    Hypercholesterolemia    Hyperlipidemia    Hypertension    off bp meds 6 months due to low bp   Hypothyroidism    Inversion, nipple    right   Stomach ulcer    diagnosed 08/2017    Type 2 DM    does not check cbg, last hemaglobin 04-04-2021 was 5.8 on 04-04-2021 at dr pharr office    Allergies  Allergen Reactions   Ibuprofen Nausea Only   Amlodipine     Other reaction(s): tore nerves to pieces   Fluvastatin     Other reaction(s):  Elevated transaminases   Meloxicam     Caused ulcer    Nsaids Nausea And Vomiting    With prolonged use    ROS: Negative except as per HPI above  Objective:  General: AAO x3, NAD  Dermatological: With inspection and palpation of the right and left lower extremities there are no open sores, no preulcerative lesions, no rash or signs of infection present. Nails are of normal length thickness and coloration.   Vascular:  Dorsalis Pedis artery and Posterior Tibial artery pedal pulses are 2/4 bilateral.  Capillary fill time < 3 sec to all digits.   Neruologic: Subjective sensation of burning and tingling pain especially at night.  Bilateral foot.  Musculoskeletal: Pes planus deformity bilateral foot.  There is edema of the left calf with some pain with side-to-side compression mid calf.  No erythema no open wound present.  Gait: Unassisted, Nonantalgic.   No images are attached to the encounter.  Assessment:   1. Pes planus of both feet   2. Edema of left lower leg   3. Acquired hammertoes of both feet   4. Diabetes  mellitus type 2 with peripheral artery disease (HCC)      Plan:  Patient was evaluated and treated and all questions answered.  #Pes planus of both feet -Recommend custom-made orthotics for the patient given her foot deformity as well as chronic ongoing pain. -Patient was casted for custom made orthotics at this visit.  #Neuropathic pain bilateral lower extremity -Discussed possible gabapentin use with the patient however she wants to defer at this time as she does not want the possible side effects of the medication.  Recommend continued monitoring  #Left lower extremity swelling  -Rule out DVT left lower extremity -Recommend ultrasound DVT evaluation patient understands need to get this done to rule out DVT -If negative for DVT recommend compression therapy for chronic edema  Return for With Dr. Donzetta Matters for routine care in late January.          Corinna Gab, DPM [facility] / [facility]

## 2022-04-30 ENCOUNTER — Ambulatory Visit (HOSPITAL_COMMUNITY)
Admission: RE | Admit: 2022-04-30 | Discharge: 2022-04-30 | Disposition: A | Discharge status: Home / Self Care | Payer: Medicare HMO | Source: Ambulatory Visit | Attending: Cardiology | Admitting: Cardiology

## 2022-04-30 DIAGNOSIS — R600 Localized edema: Secondary | ICD-10-CM | POA: Insufficient documentation

## 2022-05-17 DIAGNOSIS — M810 Age-related osteoporosis without current pathological fracture: Secondary | ICD-10-CM | POA: Diagnosis not present

## 2022-06-12 ENCOUNTER — Other Ambulatory Visit: Payer: Self-pay | Admitting: Hematology and Oncology

## 2022-06-12 DIAGNOSIS — D500 Iron deficiency anemia secondary to blood loss (chronic): Secondary | ICD-10-CM

## 2022-06-13 ENCOUNTER — Other Ambulatory Visit: Payer: Self-pay

## 2022-06-13 ENCOUNTER — Inpatient Hospital Stay: Payer: Medicare HMO | Admitting: Hematology and Oncology

## 2022-06-13 ENCOUNTER — Inpatient Hospital Stay: Payer: Medicare HMO | Attending: Hematology and Oncology

## 2022-06-13 VITALS — BP 182/66 | HR 65 | Temp 98.1°F | Resp 15 | Wt 143.9 lb

## 2022-06-13 DIAGNOSIS — D509 Iron deficiency anemia, unspecified: Secondary | ICD-10-CM | POA: Diagnosis not present

## 2022-06-13 DIAGNOSIS — D500 Iron deficiency anemia secondary to blood loss (chronic): Secondary | ICD-10-CM

## 2022-06-13 DIAGNOSIS — M8580 Other specified disorders of bone density and structure, unspecified site: Secondary | ICD-10-CM | POA: Insufficient documentation

## 2022-06-13 DIAGNOSIS — D649 Anemia, unspecified: Secondary | ICD-10-CM | POA: Diagnosis not present

## 2022-06-13 DIAGNOSIS — Z79899 Other long term (current) drug therapy: Secondary | ICD-10-CM | POA: Insufficient documentation

## 2022-06-13 LAB — CBC WITH DIFFERENTIAL (CANCER CENTER ONLY)
Abs Immature Granulocytes: 0.01 10*3/uL (ref 0.00–0.07)
Basophils Absolute: 0 10*3/uL (ref 0.0–0.1)
Basophils Relative: 1 %
Eosinophils Absolute: 0.1 10*3/uL (ref 0.0–0.5)
Eosinophils Relative: 2 %
HCT: 44 % (ref 36.0–46.0)
Hemoglobin: 13.8 g/dL (ref 12.0–15.0)
Immature Granulocytes: 0 %
Lymphocytes Relative: 25 %
Lymphs Abs: 1.3 10*3/uL (ref 0.7–4.0)
MCH: 28.2 pg (ref 26.0–34.0)
MCHC: 31.4 g/dL (ref 30.0–36.0)
MCV: 90 fL (ref 80.0–100.0)
Monocytes Absolute: 0.5 10*3/uL (ref 0.1–1.0)
Monocytes Relative: 10 %
Neutro Abs: 3.1 10*3/uL (ref 1.7–7.7)
Neutrophils Relative %: 62 %
Platelet Count: 236 10*3/uL (ref 150–400)
RBC: 4.89 MIL/uL (ref 3.87–5.11)
RDW: 19.4 % — ABNORMAL HIGH (ref 11.5–15.5)
WBC Count: 5 10*3/uL (ref 4.0–10.5)
nRBC: 0 % (ref 0.0–0.2)

## 2022-06-13 LAB — RETIC PANEL
Immature Retic Fract: 9.3 % (ref 2.3–15.9)
RBC.: 4.84 MIL/uL (ref 3.87–5.11)
Retic Count, Absolute: 31.5 10*3/uL (ref 19.0–186.0)
Retic Ct Pct: 0.7 % (ref 0.4–3.1)
Reticulocyte Hemoglobin: 32.2 pg (ref >27.9)

## 2022-06-13 LAB — CMP (CANCER CENTER ONLY)
ALT: 12 U/L (ref 0–44)
AST: 23 U/L (ref 15–41)
Albumin: 4.2 g/dL (ref 3.5–5.0)
Alkaline Phosphatase: 55 U/L (ref 38–126)
Anion gap: 5 (ref 5–15)
BUN: 17 mg/dL (ref 8–23)
CO2: 34 mmol/L — ABNORMAL HIGH (ref 22–32)
Calcium: 8.8 mg/dL — ABNORMAL LOW (ref 8.9–10.3)
Chloride: 104 mmol/L (ref 98–111)
Creatinine: 0.89 mg/dL (ref 0.44–1.00)
GFR, Estimated: >60 mL/min (ref >60)
Glucose, Bld: 97 mg/dL (ref 70–99)
Potassium: 3.9 mmol/L (ref 3.5–5.1)
Sodium: 143 mmol/L (ref 135–145)
Total Bilirubin: 0.5 mg/dL (ref 0.3–1.2)
Total Protein: 7.1 g/dL (ref 6.5–8.1)

## 2022-06-13 LAB — IRON AND IRON BINDING CAPACITY (CC-WL,HP ONLY)
Iron: 81 ug/dL (ref 28–170)
Saturation Ratios: 21 % (ref 10.4–31.8)
TIBC: 381 ug/dL (ref 250–450)
UIBC: 300 ug/dL (ref 148–442)

## 2022-06-13 LAB — FERRITIN: Ferritin: 60 ng/mL (ref 11–307)

## 2022-06-13 NOTE — Progress Notes (Signed)
[facility] [phone]   [phone]  PROGRESS NOTE  Patient Care Team: Merri Brunette, MD as PCP - General (Internal Medicine)  Hematological/Oncological History # Iron Deficiency Anemia of Unclear Etiology  03/12/2022 Established care with Dr. Leonides Schanz  10/27-11/27/2023: IV iron sucrose 200 mg x 5 doses.  06/13/2022: WBC 5.0, Hgb 13.8, MCV 90, Plt 236  Interval History:  Sandra Harvey 82 y.o. female with medical history significant for iron deficiency anemia who presents for a follow up visit. The patient's last visit was on 03/12/2022. In the interim since the last visit she received IV iron sucrose from 10/27-11/27/2023.  On exam today Sandra Harvey notes that she tolerated the IV iron infusions quite well.  She notes that she felt better almost immediately after receiving the first dose.  She reports that she got a "boost of energy".  She notes her energy is now about a 7-8 out of 10.  She notes that she tends to go to bed late and wake up later.  She reports that she can sometimes sleep to 930 10 AM while staying up till 3 AM.  She reports that she is not having any overt signs of bleeding such as bleeding, bruising, or dark stools.  She notes that she underwent colonoscopy in 2021 with Dr. Elnoria Howard.  She notes she is not had any infectious symptoms such as fevers, chills, sweats, nausea, vomiting or diarrhea.  A full 10 point ROS was otherwise negative.  MEDICAL HISTORY:  Past Medical History:  Diagnosis Date   Allergy    Anxiety    Arthritis    oa   Bilateral inguinal hernia 04/06/2021   Cataract    Complication of anesthesia    slow to wake up  after 2019 colonscopy at dr hung office   Depression    Dry eye syndrome    both eyes   Fuchs' corneal dystrophy    GERD (gastroesophageal reflux disease)    GI bleed 2021   Headache    History of migraine headaches    none recne per pt on 04-06-2021   History of osteopenia     Hypercholesterolemia    Hyperlipidemia    Hypertension    off bp meds 6 months due to low bp   Hypothyroidism    Inversion, nipple    right   Stomach ulcer    diagnosed 08/2017    Type 2 DM    does not check cbg, last hemaglobin 04-04-2021 was 5.8 on 04-04-2021 at dr pharr office    SURGICAL HISTORY: Past Surgical History:  Procedure Laterality Date   belly button surgery  2019   dr Ezzard Standing   bilteral thumb replacement     both yrs ago per pt on 04-07-2021   BIOPSY  10/16/2019   Procedure: BIOPSY;  Surgeon: Jeani Hawking, MD;  Location: [facility];  Service: Endoscopy;;   COLONOSCOPY WITH PROPOFOL N/A 10/16/2019   Procedure: COLONOSCOPY WITH PROPOFOL;  Surgeon: Jeani Hawking, MD;  Location: [facility];  Service: Endoscopy;  Laterality: N/A;   CORNEAL TRANSPLANT Bilateral    2007, 2010   DILATION AND CURETTAGE OF UTERUS     yrs ago per pt on 04-06-2021   ENTEROSCOPY N/A 10/16/2019   Procedure: ENTEROSCOPY;  Surgeon: Jeani Hawking, MD;  Location: [facility];  Service: Endoscopy;  Laterality: N/A;   EYE SURGERY Bilateral    both eyes cataracts yrs ago per pt on 04-07-2021   HAND SURGERY  02/2011  INGUINAL HERNIA REPAIR Bilateral 04/10/2021   Procedure: LAPAROSCOPIC BILATERAL INGUINAL HERNIA REPAIR WITH MESH;  Surgeon: Quentin Ore, MD;  Location: [facility];  Service: General;  Laterality: Bilateral;   left carpal tunnel release  Left 2019   at [facility] [place]   right carpal tunnel release     yrs ago per pt on 04-07-2021   right foot morton's neuroma surgery   1990   1980's and 1990's   right tennis elbow surgery     1990's   THYROIDECTOMY, PARTIAL  02/1971   center part removed per pt   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   Procedure: RIGHT TOTAL HIP ARTHROPLASTY ANTERIOR APPROACH;  Surgeon: Durene Romans, MD;  Location: [facility];  Service: Orthopedics;  Laterality: Right;  70 mins   UMBILICAL HERNIA REPAIR  05/2017   dr  Ezzard Standing   UPPER GI ENDOSCOPY  2021   and colonscopy @ [facility]  without problems    SOCIAL HISTORY: Social History   Socioeconomic History   Marital status: Married    Spouse name: Not on file   Number of children: Not on file   Years of education: Not on file   Highest education level: Not on file  Occupational History   Not on file  Tobacco Use   Smoking status: Former    Packs/day: 1.00    Years: 30.00    Total pack years: 30.00    Types: Cigarettes    Quit date: 11/07/1992    Years since quitting: 29.6   Smokeless tobacco: Never  Vaping Use   Vaping Use: Never used  Substance and Sexual Activity   Alcohol use: Yes    Alcohol/week: 1.0 standard drink of alcohol    Types: 1 Glasses of wine per week    Comment: wine on occasion  2 or 3 x week   Drug use: No   Sexual activity: Yes    Birth control/protection: None  Other Topics Concern   Not on file  Social History Narrative   Not on file   Social Determinants of Health   Financial Resource Strain: Not on file  Food Insecurity: Not on file  Transportation Needs: Not on file  Physical Activity: Not on file  Stress: Not on file  Social Connections: Not on file  Intimate Partner Violence: Not on file    FAMILY HISTORY: Family History  Problem Relation Age of Onset   Diabetes Sister    Hypertension Sister    Breast cancer Neg Hx     ALLERGIES:  is allergic to ibuprofen, amlodipine, fluvastatin, meloxicam, and nsaids.  MEDICATIONS:  Current Outpatient Medications  Medication Sig Dispense Refill   acetaminophen (TYLENOL) 500 MG tablet Take 1,000 mg by mouth every 6 (six) hours as needed for moderate pain.     albuterol (VENTOLIN HFA) 108 (90 Base) MCG/ACT inhaler Inhale 2 puffs into the lungs every 6 (six) hours as needed for wheezing or shortness of breath. 8 g 6   alum & mag hydroxide-simeth (MAALOX/MYLANTA) 200-200-20 MG/5ML suspension Take 5 mLs by mouth every 6 (six) hours as needed for indigestion or  heartburn.     aspirin 81 MG EC tablet Take by mouth.     cetirizine (ZYRTEC) 10 MG tablet Take 10 mg by mouth daily as needed for allergies.     Cetirizine HCl 10 MG CAPS Take by mouth. (Patient not taking: Reported on 04/16/2022)     clobetasol (TEMOVATE) 0.05 % external solution clobetasol  0.05 % scalp solution  APPLY TO AFFECTED AREA TWICE A DAY AS NEEDED (NOT TO FACE, GROIN, AXILLA)     Cyanocobalamin 2500 MCG SUBL 2,500 mcg.     denosumab (PROLIA) 60 MG/ML SOSY injection Inject 60 mg into the skin every 6 (six) months. Scheduled 10/20/2019     hydrocortisone cream 1 % Apply 1 application topically daily as needed for itching.     levothyroxine (SYNTHROID) 75 MCG tablet Take 75 mcg by mouth daily before breakfast. Takes 1/2 tab of 75 mcg on Sunday only and 75 mcg every other day     metFORMIN (GLUCOPHAGE-XR) 500 MG 24 hr tablet Take 500 mg by mouth 2 (two) times daily after a meal.     metFORMIN (GLUCOPHAGE-XR) 500 MG 24 hr tablet Take 1 tablet by mouth 2 (two) times daily.     rosuvastatin (CRESTOR) 20 MG tablet Take 20 mg by mouth daily after supper.     sertraline (ZOLOFT) 50 MG tablet Take 50 mg by mouth daily after breakfast.     telmisartan (MICARDIS) 20 MG tablet Take 20 mg by mouth daily. (Patient not taking: Reported on 04/16/2022)     [place] Name vitamin b 12  2, 500 mcg sl daily     UNABLE TO FIND Med Name: childrens multi vitamin gummies  taking 2 po daily     VITAMIN D PO Take 100 mg by mouth 2 (two) times daily.     No current facility-administered medications for this visit.    REVIEW OF SYSTEMS:   Constitutional: ( - ) fevers, ( - )  chills , ( - ) night sweats Eyes: ( - ) blurriness of vision, ( - ) double vision, ( - ) watery eyes Ears, nose, mouth, throat, and face: ( - ) mucositis, ( - ) sore throat Respiratory: ( - ) cough, ( - ) dyspnea, ( - ) wheezes Cardiovascular: ( - ) palpitation, ( - ) chest discomfort, ( - ) lower extremity  swelling Gastrointestinal:  ( - ) nausea, ( - ) heartburn, ( - ) change in bowel habits Skin: ( - ) abnormal skin rashes Lymphatics: ( - ) new lymphadenopathy, ( - ) easy bruising Neurological: ( - ) numbness, ( - ) tingling, ( - ) new weaknesses Behavioral/Psych: ( - ) mood change, ( - ) new changes  All other systems were reviewed with the patient and are negative.  PHYSICAL EXAMINATION:  Vitals:   06/13/22 1042  BP: (!) 182/66  Pulse: 65  Resp: 15  Temp: 98.1 F (36.7 C)  SpO2: 95%   Filed Weights   06/13/22 1042  Weight: 143 lb 14.4 oz (65.3 kg)    GENERAL: Well-appearing elderly Caucasian female, alert, no distress and comfortable SKIN: skin color, texture, turgor are normal, no rashes or significant lesions EYES: conjunctiva are pink and non-injected, sclera clear LUNGS: clear to auscultation and percussion with normal breathing effort HEART: regular rate & rhythm and no murmurs and no lower extremity edema Musculoskeletal: no cyanosis of digits and no clubbing  PSYCH: alert & oriented x 3, fluent speech NEURO: no focal motor/sensory deficits  LABORATORY DATA:  I have reviewed the data as listed    Latest Ref Rng & Units 06/13/2022   10:06 AM 03/12/2022    3:32 PM 04/10/2021    1:09 PM  CBC  WBC 4.0 - 10.5 K/uL 5.0  6.3    Hemoglobin 12.0 - 15.0 g/dL 13.8  10.5  13.3   Hematocrit 36.0 - 46.0 % 44.0  35.2  39.0   Platelets 150 - 400 K/uL 236  350         Latest Ref Rng & Units 06/13/2022   10:06 AM 03/12/2022    3:32 PM 04/10/2021    1:09 PM  CMP  Glucose 70 - 99 mg/dL 97  89  87   BUN 8 - 23 mg/dL 17  16  14    Creatinine 0.44 - 1.00 mg/dL 0.89  0.83  1.10   Sodium 135 - 145 mmol/L 143  141  141   Potassium 3.5 - 5.1 mmol/L 3.9  4.0  4.0   Chloride 98 - 111 mmol/L 104  104  104   CO2 22 - 32 mmol/L 34  30    Calcium 8.9 - 10.3 mg/dL 8.8  9.4    Total Protein 6.5 - 8.1 g/dL 7.1  7.1    Total Bilirubin 0.3 - 1.2 mg/dL 0.5  0.4    Alkaline Phos 38 - 126  U/L 55  51    AST 15 - 41 U/L 23  25    ALT 0 - 44 U/L 12  13      Lab Results  Component Value Date   MPROTEIN Not Observed 03/12/2022   Lab Results  Component Value Date   KPAFRELGTCHN 26.0 (H) 03/12/2022   LAMBDASER 18.4 03/12/2022   KAPLAMBRATIO 1.41 03/12/2022     RADIOGRAPHIC STUDIES: No results found.  ASSESSMENT & PLAN Sandra Harvey 82 y.o. female with medical history significant for iron deficiency anemia who presents for a follow up visit.  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 intake.  Patient has had colonoscopy in 2021 with no clear source of bleeding. --Encouraged her to follow-up with GI, will make prompt referral if iron levels continue to fall. --Continue ferrous sulfate 325 mg daily with a source of vitamin C --We will plan to proceed with IV iron therapy in order to help bolster the patient's blood counts if her iron levels are found to be low again. --Labs today show WBC 5.0, Hgb 13.8, mCV 90, Plt 236 --Plan for return to clinic in 6 months time for repeat lab check and assessment.   No orders of the defined types were placed in this encounter.   All questions were answered. The patient knows to call the clinic with any problems, questions or concerns.  A total of more than 25 minutes were spent on this encounter with face-to-face time and non-face-to-face time, including preparing to see the patient, ordering tests and/or medications, counseling the patient and coordination of care as outlined above.   Ledell Peoples, MD Department of Hematology/Oncology [place] at [facility] Phone: [phone] Pager: [phone] Email: [email or web address]  06/13/2022 10:50 AM

## 2022-06-14 ENCOUNTER — Ambulatory Visit: Payer: Medicare HMO | Admitting: Podiatry

## 2022-06-14 ENCOUNTER — Encounter: Payer: Self-pay | Admitting: Podiatry

## 2022-06-14 VITALS — BP 156/82

## 2022-06-14 DIAGNOSIS — E1151 Type 2 diabetes mellitus with diabetic peripheral angiopathy without gangrene: Secondary | ICD-10-CM

## 2022-06-14 DIAGNOSIS — L84 Corns and callosities: Secondary | ICD-10-CM | POA: Diagnosis not present

## 2022-06-14 DIAGNOSIS — B351 Tinea unguium: Secondary | ICD-10-CM

## 2022-06-14 DIAGNOSIS — E1169 Type 2 diabetes mellitus with other specified complication: Secondary | ICD-10-CM | POA: Diagnosis not present

## 2022-06-14 NOTE — Progress Notes (Signed)
  Subjective:  Patient ID: Sandra Harvey, female    DOB: 03-23-1941,  MRN: [id number]  Kadesia Robel Lenig presents to clinic today for at risk foot care. Pt has h/o NIDDM with PAD and corn(s) R 3rd toe and painful thick toenails that are difficult to trim. Painful toenails interfere with ambulation. Aggravating factors include wearing enclosed shoe gear. Pain is relieved with periodic professional debridement. Painful corns are aggravated when weightbearing when wearing enclosed shoe gear. Pain is relieved with periodic professional debridement.  Chief Complaint  Patient presents with   Nail Problem    DFC  BG - pt does not recall A1C - pt does not recall  PCP - Dr Shelia Media, last OV 12/23   New problem(s): None.   PCP is Deland Pretty, MD.  Allergies  Allergen Reactions   Ibuprofen Nausea Only   Amlodipine     Other reaction(s): tore nerves to pieces   Fluvastatin     Other reaction(s): Elevated transaminases   Meloxicam     Caused ulcer    Nsaids Nausea And Vomiting    With prolonged use    Review of Systems: Negative except as noted in the HPI.  Objective: No changes noted in today's physical examination. Vitals:   06/14/22 1451  BP: (!) 156/82   Gabryella Murfin Samaan is a pleasant 82 y.o. female WD, WN in NAD. AAO x 3.  Vascular Examination: CFT <3 seconds b/l LE. Palpable DP pulse(s) b/l LE. Diminished PT pulse(s) b/l LE. No pain with calf compression b/l. Lower extremity skin temperature gradient within normal limits. Trace edema noted BLE. Varicosities present b/l. No ischemia or gangrene noted b/l LE. No cyanosis or clubbing noted b/l LE.  Dermatological Examination: Pedal skin thin, shiny and atrophic b/l LE. No open wounds b/l LE. No interdigital macerations noted b/l LE. Toenails 1-5 b/l elongated, discolored, dystrophic, thickened, crumbly with subungual debris and tenderness to dorsal palpation.   Hyperkeratotic lesion(s) R 3rd toe.  No erythema,  no edema, no drainage, no fluctuance.  Neurological Examination: Protective sensation decreased with 10 gram monofilament b/l. Vibratory sensation decreased b/l.  Musculoskeletal Examination: Muscle strength 5/5 to all lower extremity muscle groups bilaterally. Hammertoe deformity noted 2-5 b/l.  Assessment/Plan: 1. Onychomycosis of multiple toenails with type 2 diabetes mellitus and peripheral angiopathy ([place])   2. Corns   3. Diabetes mellitus type 2 with peripheral artery disease ([place])     -Consent given for treatment as described below: -Examined patient. -Continue foot and shoe inspections daily. Monitor blood glucose per PCP/Endocrinologist's recommendations. -Toenails 1-5 b/l were debrided in length and girth with sterile nail nippers and dremel without iatrogenic bleeding.  -Corn(s) R 3rd toe pared utilizing sterile scalpel blade without complication or incident. Total number debrided=1. -Patient/POA to call should there be question/concern in the interim.   Return in about 3 months (around 09/13/2022).  Marzetta Board, DPM

## 2022-07-11 ENCOUNTER — Ambulatory Visit: Payer: Medicare HMO | Admitting: Podiatry

## 2022-07-11 ENCOUNTER — Ambulatory Visit (INDEPENDENT_AMBULATORY_CARE_PROVIDER_SITE_OTHER): Payer: Medicare HMO

## 2022-07-11 ENCOUNTER — Encounter: Payer: Self-pay | Admitting: Podiatry

## 2022-07-11 DIAGNOSIS — S99921A Unspecified injury of right foot, initial encounter: Secondary | ICD-10-CM

## 2022-07-11 NOTE — Progress Notes (Signed)
  Subjective:  Patient ID: Sandra Harvey, female    DOB: 27-Aug-1940,   MRN: [id number]  Chief Complaint  Patient presents with   Toe Injury    Patient drop a cast iron pan on foot states it was painful but now she can walk on it , ROM. No swelling but there is a blood blister     82 y.o. female presents for new concern of  right toe injury that occurred a few days ago. Relates dropping a cast iron pan on her toe and developed a blood blister. Denies too much pain currently . Denies any other pedal complaints. Denies n/v/f/c.   Past Medical History:  Diagnosis Date   Allergy    Anxiety    Arthritis    oa   Bilateral inguinal hernia 04/06/2021   Cataract    Complication of anesthesia    slow to wake up  after 2019 colonscopy at dr hung office   Depression    Dry eye syndrome    both eyes   Fuchs' corneal dystrophy    GERD (gastroesophageal reflux disease)    GI bleed 2021   Headache    History of migraine headaches    none recne per pt on 04-06-2021   History of osteopenia    Hypercholesterolemia    Hyperlipidemia    Hypertension    off bp meds 6 months due to low bp   Hypothyroidism    Inversion, nipple    right   Stomach ulcer    diagnosed 08/2017    Type 2 DM    does not check cbg, last hemaglobin 04-04-2021 was 5.8 on 04-04-2021 at dr pharr office    Objective:  Physical Exam: Vascular: DP/PT pulses 2/4 bilateral. CFT <3 seconds. Normal hair growth on digits. No edema.  Skin. No lacerations or abrasions bilateral feet.  Musculoskeletal: MMT 5/5 bilateral lower extremities in DF, PF, Inversion and Eversion. Deceased ROM in DF of ankle joint. Right second digit with blood blister noted to proximal aspect of second digit nail fold. No open ulceration noted. Minimal pain to palpation.  Neurological: Sensation intact to light touch.   Assessment:   1. Injury of toe on right foot, initial encounter      Plan:  Patient was evaluated and treated and all  questions answered. -Xrays reviewed. No acute fractures or dislocations noted.  -Discussed treatement options for toe injury -Discussed keeping neosporin and bandaid over area but should heal up.  -Recommend protection, rest, ice, elevation daily until symptoms improve -Rx pain med/antinflammatories as needed -Patient to return to office as scheduled for rfc.    Lorenda Peck, DPM

## 2022-07-18 ENCOUNTER — Ambulatory Visit: Payer: Medicare HMO | Admitting: Podiatry

## 2022-08-29 ENCOUNTER — Encounter: Payer: Self-pay | Admitting: Podiatry

## 2022-08-29 ENCOUNTER — Ambulatory Visit: Payer: Medicare HMO | Admitting: Podiatry

## 2022-08-29 DIAGNOSIS — E1151 Type 2 diabetes mellitus with diabetic peripheral angiopathy without gangrene: Secondary | ICD-10-CM | POA: Diagnosis not present

## 2022-08-29 DIAGNOSIS — B351 Tinea unguium: Secondary | ICD-10-CM

## 2022-08-29 DIAGNOSIS — E1169 Type 2 diabetes mellitus with other specified complication: Secondary | ICD-10-CM

## 2022-08-29 DIAGNOSIS — L84 Corns and callosities: Secondary | ICD-10-CM

## 2022-08-29 NOTE — Progress Notes (Signed)
  Subjective:  Patient ID: Sandra Harvey, female    DOB: 1941-04-25,  MRN: [id number]  Sandra Harvey presents to clinic today for at risk foot care. Pt has h/o NIDDM with PAD and corn(s) R 3rd toe and painful thick toenails that are difficult to trim. Painful toenails interfere with ambulation. Aggravating factors include wearing enclosed shoe gear. Pain is relieved with periodic professional debridement. Painful corns are aggravated when weightbearing when wearing enclosed shoe gear. Pain is relieved with periodic professional debridement.  Chief Complaint  Patient presents with   diabetic foot care    New problem(s): None.   PCP is Merri Brunette, MD.  Allergies  Allergen Reactions   Ibuprofen Nausea Only   Amlodipine     Other reaction(s): tore nerves to pieces   Fluvastatin     Other reaction(s): Elevated transaminases   Meloxicam     Caused ulcer    Nsaids Nausea And Vomiting    With prolonged use    Review of Systems: Negative except as noted in the HPI.  Objective: No changes noted in today's physical examination. There were no vitals filed for this visit.  Sandra Harvey is a pleasant 82 y.o. female WD, WN in NAD. AAO x 3.  Vascular Examination: CFT <3 seconds b/l LE. Palpable DP pulse(s) b/l LE. Diminished PT pulse(s) b/l LE. No pain with calf compression b/l. Lower extremity skin temperature gradient within normal limits. Trace edema noted BLE. Varicosities present b/l. No ischemia or gangrene noted b/l LE. No cyanosis or clubbing noted b/l LE.  Dermatological Examination: Pedal skin thin, shiny and atrophic b/l LE. No open wounds b/l LE. No interdigital macerations noted b/l LE. Toenails 1-5 b/l elongated, discolored, dystrophic, thickened, crumbly with subungual debris and tenderness to dorsal palpation.   Hyperkeratotic lesion(s) R 3rd toe.  No erythema, no edema, no drainage, no fluctuance.  Neurological Examination: Protective  sensation decreased with 10 gram monofilament b/l. Vibratory sensation decreased b/l.  Musculoskeletal Examination: Muscle strength 5/5 to all lower extremity muscle groups bilaterally. Hammertoe deformity noted 2-5 b/l.  Assessment/Plan: 1. Onychomycosis of multiple toenails with type 2 diabetes mellitus and peripheral angiopathy   2. Corns      -Consent given for treatment as described below: -Examined patient. -Continue foot and shoe inspections daily. Monitor blood glucose per PCP/Endocrinologist's recommendations. -Toenails 1-5 b/l were debrided in length and girth with sterile nail nippers and dremel without iatrogenic bleeding.  -Corn(s) R 3rd toe pared utilizing sterile scalpel blade without complication or incident. Total number debrided=1. -Patient/POA to call should there be question/concern in the interim.   Return in about 3 months (around 11/28/2022) for rfc.  Louann Sjogren, DPM

## 2022-09-06 ENCOUNTER — Ambulatory Visit: Payer: Medicare HMO | Admitting: Podiatry

## 2022-09-26 DIAGNOSIS — E119 Type 2 diabetes mellitus without complications: Secondary | ICD-10-CM | POA: Diagnosis not present

## 2022-09-26 DIAGNOSIS — Z7984 Long term (current) use of oral hypoglycemic drugs: Secondary | ICD-10-CM | POA: Diagnosis not present

## 2022-09-26 DIAGNOSIS — Z961 Presence of intraocular lens: Secondary | ICD-10-CM | POA: Diagnosis not present

## 2022-09-26 DIAGNOSIS — Z947 Corneal transplant status: Secondary | ICD-10-CM | POA: Diagnosis not present

## 2022-11-19 DIAGNOSIS — M810 Age-related osteoporosis without current pathological fracture: Secondary | ICD-10-CM | POA: Diagnosis not present

## 2022-11-29 ENCOUNTER — Ambulatory Visit: Payer: Medicare HMO | Admitting: Podiatry

## 2022-12-06 DIAGNOSIS — M1612 Unilateral primary osteoarthritis, left hip: Secondary | ICD-10-CM | POA: Diagnosis not present

## 2022-12-06 DIAGNOSIS — Z96641 Presence of right artificial hip joint: Secondary | ICD-10-CM | POA: Diagnosis not present

## 2022-12-12 ENCOUNTER — Inpatient Hospital Stay: Payer: Medicare HMO | Attending: Hematology and Oncology

## 2022-12-12 ENCOUNTER — Other Ambulatory Visit: Payer: Self-pay | Admitting: Hematology and Oncology

## 2022-12-12 ENCOUNTER — Inpatient Hospital Stay: Payer: Medicare HMO | Admitting: Hematology and Oncology

## 2022-12-12 VITALS — BP 187/73 | HR 65 | Temp 97.7°F | Resp 13 | Wt 138.7 lb

## 2022-12-12 DIAGNOSIS — Z87891 Personal history of nicotine dependence: Secondary | ICD-10-CM | POA: Insufficient documentation

## 2022-12-12 DIAGNOSIS — D500 Iron deficiency anemia secondary to blood loss (chronic): Secondary | ICD-10-CM

## 2022-12-12 DIAGNOSIS — D509 Iron deficiency anemia, unspecified: Secondary | ICD-10-CM | POA: Diagnosis not present

## 2022-12-12 DIAGNOSIS — D649 Anemia, unspecified: Secondary | ICD-10-CM | POA: Diagnosis not present

## 2022-12-12 LAB — CBC WITH DIFFERENTIAL (CANCER CENTER ONLY)
Abs Immature Granulocytes: 0.03 10*3/uL (ref 0.00–0.07)
Basophils Absolute: 0.1 10*3/uL (ref 0.0–0.1)
Basophils Relative: 1 %
Eosinophils Absolute: 0.1 10*3/uL (ref 0.0–0.5)
Eosinophils Relative: 1 %
HCT: 45.9 % (ref 36.0–46.0)
Hemoglobin: 14.9 g/dL (ref 12.0–15.0)
Immature Granulocytes: 0 %
Lymphocytes Relative: 16 %
Lymphs Abs: 1.5 10*3/uL (ref 0.7–4.0)
MCH: 31.3 pg (ref 26.0–34.0)
MCHC: 32.5 g/dL (ref 30.0–36.0)
MCV: 96.4 fL (ref 80.0–100.0)
Monocytes Absolute: 0.8 10*3/uL (ref 0.1–1.0)
Monocytes Relative: 8 %
Neutro Abs: 6.9 10*3/uL (ref 1.7–7.7)
Neutrophils Relative %: 74 %
Platelet Count: 247 10*3/uL (ref 150–400)
RBC: 4.76 MIL/uL (ref 3.87–5.11)
RDW: 13.1 % (ref 11.5–15.5)
WBC Count: 9.3 10*3/uL (ref 4.0–10.5)
nRBC: 0 % (ref 0.0–0.2)

## 2022-12-12 LAB — IRON AND IRON BINDING CAPACITY (CC-WL,HP ONLY)
Iron: 127 ug/dL (ref 28–170)
Saturation Ratios: 34 % — ABNORMAL HIGH (ref 10.4–31.8)
TIBC: 374 ug/dL (ref 250–450)
UIBC: 247 ug/dL (ref 148–442)

## 2022-12-12 LAB — CMP (CANCER CENTER ONLY)
ALT: 11 U/L (ref 0–44)
AST: 18 U/L (ref 15–41)
Albumin: 4.5 g/dL (ref 3.5–5.0)
Alkaline Phosphatase: 54 U/L (ref 38–126)
Anion gap: 8 (ref 5–15)
BUN: 18 mg/dL (ref 8–23)
CO2: 28 mmol/L (ref 22–32)
Calcium: 9.5 mg/dL (ref 8.9–10.3)
Chloride: 105 mmol/L (ref 98–111)
Creatinine: 0.87 mg/dL (ref 0.44–1.00)
GFR, Estimated: >60 mL/min (ref >60)
Glucose, Bld: 85 mg/dL (ref 70–99)
Potassium: 4.1 mmol/L (ref 3.5–5.1)
Sodium: 141 mmol/L (ref 135–145)
Total Bilirubin: 0.6 mg/dL (ref 0.3–1.2)
Total Protein: 6.9 g/dL (ref 6.5–8.1)

## 2022-12-12 LAB — FERRITIN: Ferritin: 89 ng/mL (ref 11–307)

## 2022-12-12 LAB — RETIC PANEL
Immature Retic Fract: 9.8 % (ref 2.3–15.9)
RBC.: 4.81 MIL/uL (ref 3.87–5.11)
Retic Count, Absolute: 46.2 10*3/uL (ref 19.0–186.0)
Retic Ct Pct: 1 % (ref 0.4–3.1)
Reticulocyte Hemoglobin: 35.6 pg (ref >27.9)

## 2022-12-12 NOTE — Progress Notes (Signed)
[facility] [phone]   [phone]  PROGRESS NOTE  Patient Care Team: Merri Brunette, MD as PCP - General (Internal Medicine)  Hematological/Oncological History # Iron Deficiency Anemia of Unclear Etiology  03/12/2022 Established care with Dr. Leonides Schanz  10/27-11/27/2023: IV iron sucrose 200 mg x 5 doses.  06/13/2022: WBC 5.0, Hgb 13.8, MCV 90, Plt 236  Interval History:  Sandra Harvey 82 y.o. female with medical history significant for iron deficiency anemia who presents for a follow up visit. The patient's last visit was on 06/13/2022. In the interim since the last visit she has continued PO ferrous sulfate.   On exam today Sandra Harvey notes although her blood pressure is elevated she is not having any headache, chest pain, or shortness of breath.  She reports she did recently get new glasses that she needs to have swapped out because her vision has not improved with them.  She notes her energy levels are good and had been quite strong until early June when the levels suddenly dropped.  She reports that "took a turn for the worst".  She had been working a lot outside and thinks that the heat may have made that more difficult.  She reports that the same feeling she had previously with iron deficiency have returned and is surprised to hear that her hemoglobin levels are normal.  She has not had any bleeding, bruising, or dark stools.  She notes she is not had any infectious symptoms such as fevers, chills, sweats, nausea, vomiting or diarrhea.  A full 10 point ROS was otherwise negative.  MEDICAL HISTORY:  Past Medical History:  Diagnosis Date   Allergy    Anxiety    Arthritis    oa   Bilateral inguinal hernia 04/06/2021   Cataract    Complication of anesthesia    slow to wake up  after 2019 colonscopy at dr hung office   Depression    Dry eye syndrome    both eyes   Fuchs' corneal dystrophy    GERD (gastroesophageal reflux disease)    GI  bleed 2021   Headache    History of migraine headaches    none recne per pt on 04-06-2021   History of osteopenia    Hypercholesterolemia    Hyperlipidemia    Hypertension    off bp meds 6 months due to low bp   Hypothyroidism    Inversion, nipple    right   Stomach ulcer    diagnosed 08/2017    Type 2 DM    does not check cbg, last hemaglobin 04-04-2021 was 5.8 on 04-04-2021 at dr pharr office    SURGICAL HISTORY: Past Surgical History:  Procedure Laterality Date   belly button surgery  2019   dr Ezzard Standing   bilteral thumb replacement     both yrs ago per pt on 04-07-2021   BIOPSY  10/16/2019   Procedure: BIOPSY;  Surgeon: Jeani Hawking, MD;  Location: [facility];  Service: Endoscopy;;   COLONOSCOPY WITH PROPOFOL N/A 10/16/2019   Procedure: COLONOSCOPY WITH PROPOFOL;  Surgeon: Jeani Hawking, MD;  Location: [facility];  Service: Endoscopy;  Laterality: N/A;   CORNEAL TRANSPLANT Bilateral    2007, 2010   DILATION AND CURETTAGE OF UTERUS     yrs ago per pt on 04-06-2021   ENTEROSCOPY N/A 10/16/2019   Procedure: ENTEROSCOPY;  Surgeon: Jeani Hawking, MD;  Location: [facility];  Service: Endoscopy;  Laterality: N/A;   EYE SURGERY Bilateral  both eyes cataracts yrs ago per pt on 04-07-2021   HAND SURGERY  02/2011   INGUINAL HERNIA REPAIR Bilateral 04/10/2021   Procedure: LAPAROSCOPIC BILATERAL INGUINAL HERNIA REPAIR WITH MESH;  Surgeon: Quentin Ore, MD;  Location: [facility];  Service: General;  Laterality: Bilateral;   left carpal tunnel release  Left 2019   at [facility] [place]   right carpal tunnel release     yrs ago per pt on 04-07-2021   right foot morton's neuroma surgery   1990   1980's and 1990's   right tennis elbow surgery     1990's   THYROIDECTOMY, PARTIAL  02/1971   center part removed per pt   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   Procedure: RIGHT TOTAL HIP ARTHROPLASTY ANTERIOR APPROACH;  Surgeon: Durene Romans,  MD;  Location: [facility];  Service: Orthopedics;  Laterality: Right;  70 mins   UMBILICAL HERNIA REPAIR  05/2017   dr Ezzard Standing   UPPER GI ENDOSCOPY  2021   and colonscopy @ [facility]  without problems    SOCIAL HISTORY: Social History   Socioeconomic History   Marital status: Married    Spouse name: Not on file   Number of children: Not on file   Years of education: Not on file   Highest education level: Not on file  Occupational History   Not on file  Tobacco Use   Smoking status: Former    Current packs/day: 0.00    Average packs/day: 1 pack/day for 30.0 years (30.0 ttl pk-yrs)    Types: Cigarettes    Start date: 11/08/1962    Quit date: 11/07/1992    Years since quitting: 30.1   Smokeless tobacco: Never  Vaping Use   Vaping status: Never Used  Substance and Sexual Activity   Alcohol use: Yes    Alcohol/week: 1.0 standard drink of alcohol    Types: 1 Glasses of wine per week    Comment: wine on occasion  2 or 3 x week   Drug use: No   Sexual activity: Yes    Birth control/protection: None  Other Topics Concern   Not on file  Social History Narrative   Not on file   Social Determinants of Health   Financial Resource Strain: Not on file  Food Insecurity: Not on file  Transportation Needs: Not on file  Physical Activity: Not on file  Stress: Not on file  Social Connections: Not on file  Intimate Partner Violence: Not on file    FAMILY HISTORY: Family History  Problem Relation Age of Onset   Diabetes Sister    Hypertension Sister    Breast cancer Neg Hx     ALLERGIES:  is allergic to ibuprofen, amlodipine, fluvastatin, meloxicam, and nsaids.  MEDICATIONS:  Current Outpatient Medications  Medication Sig Dispense Refill   acetaminophen (TYLENOL) 500 MG tablet Take 1,000 mg by mouth every 6 (six) hours as needed for moderate pain.     albuterol (VENTOLIN HFA) 108 (90 Base) MCG/ACT inhaler Inhale 2 puffs into the lungs every 6 (six) hours as needed for wheezing or  shortness of breath. 8 g 6   alum & mag hydroxide-simeth (MAALOX/MYLANTA) 200-200-20 MG/5ML suspension Take 5 mLs by mouth every 6 (six) hours as needed for indigestion or heartburn.     aspirin 81 MG EC tablet Take by mouth.     cetirizine (ZYRTEC) 10 MG tablet Take 10 mg by mouth daily as needed for allergies.  Cetirizine HCl 10 MG CAPS Take by mouth. (Patient not taking: Reported on 04/16/2022)     clobetasol (TEMOVATE) 0.05 % external solution clobetasol 0.05 % scalp solution  APPLY TO AFFECTED AREA TWICE A DAY AS NEEDED (NOT TO FACE, GROIN, AXILLA)     Cyanocobalamin 2500 MCG SUBL 2,500 mcg.     denosumab (PROLIA) 60 MG/ML SOSY injection Inject 60 mg into the skin every 6 (six) months. Scheduled 10/20/2019     hydrocortisone cream 1 % Apply 1 application topically daily as needed for itching.     levothyroxine (SYNTHROID) 75 MCG tablet Take 75 mcg by mouth daily before breakfast. Takes 1/2 tab of 75 mcg on Sunday only and 75 mcg every other day     metFORMIN (GLUCOPHAGE-XR) 500 MG 24 hr tablet Take 500 mg by mouth 2 (two) times daily after a meal.     metFORMIN (GLUCOPHAGE-XR) 500 MG 24 hr tablet Take 1 tablet by mouth 2 (two) times daily.     rosuvastatin (CRESTOR) 20 MG tablet Take 20 mg by mouth daily after supper.     sertraline (ZOLOFT) 50 MG tablet Take 50 mg by mouth daily after breakfast.     telmisartan (MICARDIS) 20 MG tablet Take 20 mg by mouth daily. (Patient not taking: Reported on 04/16/2022)     UNABLE TO FIND Med Name vitamin b 12  2, 500 mcg sl daily     UNABLE TO FIND Med Name: childrens multi vitamin gummies  taking 2 po daily     VITAMIN D PO Take 100 mg by mouth 2 (two) times daily.     No current facility-administered medications for this visit.    REVIEW OF SYSTEMS:   Constitutional: ( - ) fevers, ( - )  chills , ( - ) night sweats Eyes: ( - ) blurriness of vision, ( - ) double vision, ( - ) watery eyes Ears, nose, mouth, throat, and face: ( - ) mucositis, ( - )  sore throat Respiratory: ( - ) cough, ( - ) dyspnea, ( - ) wheezes Cardiovascular: ( - ) palpitation, ( - ) chest discomfort, ( - ) lower extremity swelling Gastrointestinal:  ( - ) nausea, ( - ) heartburn, ( - ) change in bowel habits Skin: ( - ) abnormal skin rashes Lymphatics: ( - ) new lymphadenopathy, ( - ) easy bruising Neurological: ( - ) numbness, ( - ) tingling, ( - ) new weaknesses Behavioral/Psych: ( - ) mood change, ( - ) new changes  All other systems were reviewed with the patient and are negative.  PHYSICAL EXAMINATION:  Vitals:   12/12/22 1405  BP: (!) 187/73  Pulse: 65  Resp: 13  Temp: 97.7 F (36.5 C)  SpO2: 95%    Filed Weights   12/12/22 1405  Weight: 138 lb 11.2 oz (62.9 kg)     GENERAL: Well-appearing elderly Caucasian female, alert, no distress and comfortable SKIN: skin color, texture, turgor are normal, no rashes or significant lesions EYES: conjunctiva are pink and non-injected, sclera clear LUNGS: clear to auscultation and percussion with normal breathing effort HEART: regular rate & rhythm and no murmurs and no lower extremity edema Musculoskeletal: no cyanosis of digits and no clubbing  PSYCH: alert & oriented x 3, fluent speech NEURO: no focal motor/sensory deficits  LABORATORY DATA:  I have reviewed the data as listed    Latest Ref Rng & Units 12/12/2022    1:45 PM 06/13/2022   10:06 AM 03/12/2022  3:32 PM  CBC  WBC 4.0 - 10.5 K/uL 9.3  5.0  6.3   Hemoglobin 12.0 - 15.0 g/dL 40.9  81.1  91.4   Hematocrit 36.0 - 46.0 % 45.9  44.0  35.2   Platelets 150 - 400 K/uL 247  236  350        Latest Ref Rng & Units 12/12/2022    1:45 PM 06/13/2022   10:06 AM 03/12/2022    3:32 PM  CMP  Glucose 70 - 99 mg/dL 85  97  89   BUN 8 - 23 mg/dL 18  17  16    Creatinine 0.44 - 1.00 mg/dL 7.82  9.56  2.13   Sodium 135 - 145 mmol/L 141  143  141   Potassium 3.5 - 5.1 mmol/L 4.1  3.9  4.0   Chloride 98 - 111 mmol/L 105  104  104   CO2 22 - 32 mmol/L  28  34  30   Calcium 8.9 - 10.3 mg/dL 9.5  8.8  9.4   Total Protein 6.5 - 8.1 g/dL 6.9  7.1  7.1   Total Bilirubin 0.3 - 1.2 mg/dL 0.6  0.5  0.4   Alkaline Phos 38 - 126 U/L 54  55  51   AST 15 - 41 U/L 18  23  25    ALT 0 - 44 U/L 11  12  13      Lab Results  Component Value Date   MPROTEIN Not Observed 03/12/2022   Lab Results  Component Value Date   KPAFRELGTCHN 26.0 (H) 03/12/2022   LAMBDASER 18.4 03/12/2022   KAPLAMBRATIO 1.41 03/12/2022     RADIOGRAPHIC STUDIES: No results found.  ASSESSMENT & PLAN Sandra Harvey 82 y.o. female with medical history significant for iron deficiency anemia who presents for a follow up visit.  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 intake.  Patient has had colonoscopy in 2021 with no clear source of bleeding. --Encouraged her to follow-up with GI, will make prompt referral if iron levels continue to fall.  Patient followed by Dr. Elnoria Howard -- Patient unable to tolerate p.o. ferrous sulfate  --We will plan to proceed with IV iron therapy in order to help bolster the patient's blood counts if her iron levels are found to be low again. --Labs today show WBC 9.3, Hgb 14.9, MCV 96.4, Plt 247 --Plan for return to clinic in 6 months time for repeat lab check and assessment.   No orders of the defined types were placed in this encounter.   All questions were answered. The patient knows to call the clinic with any problems, questions or concerns.  A total of more than 30 minutes were spent on this encounter with face-to-face time and non-face-to-face time, including preparing to see the patient, ordering tests and/or medications, counseling the patient and coordination of care as outlined above.   Ulysees Barns, MD Department of Hematology/Oncology [facility] Phone: [phone] Pager:  [phone] Email: [email or web address]  12/12/2022 4:22 PM

## 2022-12-18 ENCOUNTER — Telehealth: Payer: Self-pay | Admitting: *Deleted

## 2022-12-18 NOTE — Telephone Encounter (Signed)
-----   Message from Nurse Lanora Manis T sent at 12/17/2022  4:43 PM EDT -----  ----- Message ----- From: Jaci Standard, MD Sent: 12/17/2022   8:47 AM EDT To: Kyra Searles, RN  Please let Mrs. Doughty know that her iron levels are strong, with a ferritin of 89. We will plan to see her back in clinic in Jan 2025. ----- Message ----- From: Leory Plowman, Lab [facility] [place] Sent: 12/12/2022   1:55 PM EDT To: Jaci Standard, MD

## 2022-12-18 NOTE — Telephone Encounter (Signed)
Notified of message below

## 2022-12-18 NOTE — Telephone Encounter (Signed)
LM to call Dr Derek [place] office

## 2022-12-26 DIAGNOSIS — M810 Age-related osteoporosis without current pathological fracture: Secondary | ICD-10-CM | POA: Diagnosis not present

## 2022-12-26 DIAGNOSIS — E039 Hypothyroidism, unspecified: Secondary | ICD-10-CM | POA: Diagnosis not present

## 2022-12-26 DIAGNOSIS — I10 Essential (primary) hypertension: Secondary | ICD-10-CM | POA: Diagnosis not present

## 2022-12-26 DIAGNOSIS — E119 Type 2 diabetes mellitus without complications: Secondary | ICD-10-CM | POA: Diagnosis not present

## 2022-12-31 DIAGNOSIS — I700 Atherosclerosis of aorta: Secondary | ICD-10-CM | POA: Diagnosis not present

## 2022-12-31 DIAGNOSIS — E1129 Type 2 diabetes mellitus with other diabetic kidney complication: Secondary | ICD-10-CM | POA: Diagnosis not present

## 2022-12-31 DIAGNOSIS — Z0000 Encounter for general adult medical examination without abnormal findings: Secondary | ICD-10-CM | POA: Diagnosis not present

## 2022-12-31 DIAGNOSIS — I10 Essential (primary) hypertension: Secondary | ICD-10-CM | POA: Diagnosis not present

## 2022-12-31 DIAGNOSIS — K219 Gastro-esophageal reflux disease without esophagitis: Secondary | ICD-10-CM | POA: Diagnosis not present

## 2022-12-31 DIAGNOSIS — M810 Age-related osteoporosis without current pathological fracture: Secondary | ICD-10-CM | POA: Diagnosis not present

## 2022-12-31 DIAGNOSIS — E1140 Type 2 diabetes mellitus with diabetic neuropathy, unspecified: Secondary | ICD-10-CM | POA: Diagnosis not present

## 2022-12-31 DIAGNOSIS — F339 Major depressive disorder, recurrent, unspecified: Secondary | ICD-10-CM | POA: Diagnosis not present

## 2022-12-31 DIAGNOSIS — J310 Chronic rhinitis: Secondary | ICD-10-CM | POA: Diagnosis not present

## 2022-12-31 DIAGNOSIS — E538 Deficiency of other specified B group vitamins: Secondary | ICD-10-CM | POA: Diagnosis not present

## 2022-12-31 DIAGNOSIS — E039 Hypothyroidism, unspecified: Secondary | ICD-10-CM | POA: Diagnosis not present

## 2022-12-31 DIAGNOSIS — D509 Iron deficiency anemia, unspecified: Secondary | ICD-10-CM | POA: Diagnosis not present

## 2023-01-02 ENCOUNTER — Encounter: Payer: Self-pay | Admitting: Internal Medicine

## 2023-01-09 ENCOUNTER — Ambulatory Visit: Payer: Medicare HMO | Admitting: Podiatry

## 2023-01-23 DIAGNOSIS — E1140 Type 2 diabetes mellitus with diabetic neuropathy, unspecified: Secondary | ICD-10-CM | POA: Diagnosis not present

## 2023-01-23 DIAGNOSIS — E7800 Pure hypercholesterolemia, unspecified: Secondary | ICD-10-CM | POA: Diagnosis not present

## 2023-01-23 DIAGNOSIS — I10 Essential (primary) hypertension: Secondary | ICD-10-CM | POA: Diagnosis not present

## 2023-02-11 DIAGNOSIS — M1612 Unilateral primary osteoarthritis, left hip: Secondary | ICD-10-CM | POA: Diagnosis not present

## 2023-02-11 DIAGNOSIS — M7062 Trochanteric bursitis, left hip: Secondary | ICD-10-CM | POA: Diagnosis not present

## 2023-02-27 DIAGNOSIS — E7800 Pure hypercholesterolemia, unspecified: Secondary | ICD-10-CM | POA: Diagnosis not present

## 2023-02-27 DIAGNOSIS — I10 Essential (primary) hypertension: Secondary | ICD-10-CM | POA: Diagnosis not present

## 2023-02-27 DIAGNOSIS — Z23 Encounter for immunization: Secondary | ICD-10-CM | POA: Diagnosis not present

## 2023-02-27 DIAGNOSIS — E1140 Type 2 diabetes mellitus with diabetic neuropathy, unspecified: Secondary | ICD-10-CM | POA: Diagnosis not present

## 2023-04-03 DIAGNOSIS — M810 Age-related osteoporosis without current pathological fracture: Secondary | ICD-10-CM | POA: Diagnosis not present

## 2023-05-20 DIAGNOSIS — M79642 Pain in left hand: Secondary | ICD-10-CM | POA: Diagnosis not present

## 2023-05-20 DIAGNOSIS — R2232 Localized swelling, mass and lump, left upper limb: Secondary | ICD-10-CM | POA: Diagnosis not present

## 2023-05-20 DIAGNOSIS — M13841 Other specified arthritis, right hand: Secondary | ICD-10-CM | POA: Diagnosis not present

## 2023-05-23 DIAGNOSIS — M810 Age-related osteoporosis without current pathological fracture: Secondary | ICD-10-CM | POA: Diagnosis not present

## 2023-06-07 ENCOUNTER — Telehealth: Payer: Self-pay | Admitting: Hematology and Oncology

## 2023-06-14 ENCOUNTER — Ambulatory Visit: Payer: Medicare HMO | Admitting: Hematology and Oncology

## 2023-06-14 ENCOUNTER — Other Ambulatory Visit: Payer: Medicare HMO

## 2023-08-02 ENCOUNTER — Inpatient Hospital Stay: Payer: Medicare HMO | Attending: Hematology and Oncology

## 2023-08-02 ENCOUNTER — Inpatient Hospital Stay: Payer: Medicare HMO | Admitting: Hematology and Oncology

## 2023-08-02 ENCOUNTER — Other Ambulatory Visit: Payer: Self-pay | Admitting: Hematology and Oncology

## 2023-08-02 VITALS — BP 194/76 | HR 64 | Temp 98.0°F | Resp 13 | Wt 139.7 lb

## 2023-08-02 DIAGNOSIS — D500 Iron deficiency anemia secondary to blood loss (chronic): Secondary | ICD-10-CM

## 2023-08-02 DIAGNOSIS — D649 Anemia, unspecified: Secondary | ICD-10-CM | POA: Diagnosis not present

## 2023-08-02 DIAGNOSIS — D509 Iron deficiency anemia, unspecified: Secondary | ICD-10-CM | POA: Insufficient documentation

## 2023-08-02 LAB — CMP (CANCER CENTER ONLY)
ALT: 11 U/L (ref 0–44)
AST: 19 U/L (ref 15–41)
Albumin: 4.6 g/dL (ref 3.5–5.0)
Alkaline Phosphatase: 60 U/L (ref 38–126)
Anion gap: 5 (ref 5–15)
BUN: 18 mg/dL (ref 8–23)
CO2: 31 mmol/L (ref 22–32)
Calcium: 9.1 mg/dL (ref 8.9–10.3)
Chloride: 104 mmol/L (ref 98–111)
Creatinine: 0.86 mg/dL (ref 0.44–1.00)
GFR, Estimated: >60 mL/min (ref >60)
Glucose, Bld: 100 mg/dL — ABNORMAL HIGH (ref 70–99)
Potassium: 4 mmol/L (ref 3.5–5.1)
Sodium: 140 mmol/L (ref 135–145)
Total Bilirubin: 0.7 mg/dL (ref 0.0–1.2)
Total Protein: 7.3 g/dL (ref 6.5–8.1)

## 2023-08-02 LAB — IRON AND IRON BINDING CAPACITY (CC-WL,HP ONLY)
Iron: 72 ug/dL (ref 28–170)
Saturation Ratios: 18 % (ref 10.4–31.8)
TIBC: 393 ug/dL (ref 250–450)
UIBC: 321 ug/dL (ref 148–442)

## 2023-08-02 LAB — CBC WITH DIFFERENTIAL (CANCER CENTER ONLY)
Abs Immature Granulocytes: 0.01 10*3/uL (ref 0.00–0.07)
Basophils Absolute: 0.1 10*3/uL (ref 0.0–0.1)
Basophils Relative: 1 %
Eosinophils Absolute: 0.1 10*3/uL (ref 0.0–0.5)
Eosinophils Relative: 2 %
HCT: 44.6 % (ref 36.0–46.0)
Hemoglobin: 14.3 g/dL (ref 12.0–15.0)
Immature Granulocytes: 0 %
Lymphocytes Relative: 21 %
Lymphs Abs: 1.4 10*3/uL (ref 0.7–4.0)
MCH: 30.6 pg (ref 26.0–34.0)
MCHC: 32.1 g/dL (ref 30.0–36.0)
MCV: 95.3 fL (ref 80.0–100.0)
Monocytes Absolute: 0.6 10*3/uL (ref 0.1–1.0)
Monocytes Relative: 8 %
Neutro Abs: 4.5 10*3/uL (ref 1.7–7.7)
Neutrophils Relative %: 68 %
Platelet Count: 264 10*3/uL (ref 150–400)
RBC: 4.68 MIL/uL (ref 3.87–5.11)
RDW: 13.2 % (ref 11.5–15.5)
WBC Count: 6.6 10*3/uL (ref 4.0–10.5)
nRBC: 0 % (ref 0.0–0.2)

## 2023-08-02 LAB — RETIC PANEL
Immature Retic Fract: 9.3 % (ref 2.3–15.9)
RBC.: 4.72 MIL/uL (ref 3.87–5.11)
Retic Count, Absolute: 37.8 10*3/uL (ref 19.0–186.0)
Retic Ct Pct: 0.8 % (ref 0.4–3.1)
Reticulocyte Hemoglobin: 35.1 pg (ref >27.9)

## 2023-08-02 LAB — FERRITIN: Ferritin: 64 ng/mL (ref 11–307)

## 2023-08-02 NOTE — Progress Notes (Signed)
 [facility] [phone]   [phone]  PROGRESS NOTE  Patient Care Team: Merri Brunette, MD as PCP - General (Internal Medicine)  Hematological/Oncological History # Iron Deficiency Anemia of Unclear Etiology  03/12/2022 Established care with Dr. Leonides Schanz  10/27-11/27/2023: IV iron sucrose 200 mg x 5 doses.  06/13/2022: WBC 5.0, Hgb 13.8, MCV 90, Plt 236  Interval History:  Sandra Harvey 83 y.o. female with medical history significant for iron deficiency anemia who presents for a follow up visit. The patient's last visit was on 12/12/2022. In the interim since the last visit she has continued PO ferrous sulfate.   On exam today Sandra Harvey notes she is surprised her blood pressure is still high on measurement today.  We measured 194/76, she reports it normally runs 1 40-1 50 the at home.  She reports that she does bruise easily.  She notes that she is not having any bleeding elsewhere such as nosebleeds, gum bleeding, or dark stools.  She reports that she is not currently taking iron pills as they are too much for her stomach to handle.  She reports overall she feels good, but she does have good and bad days.  She reports that she does have some occasional light headedness and dizziness but no shortness of breath.  She reports that walking down the end of her driveway can sometimes leave her quite short of breath.  She reports that she is not eating much in the way of iron rich food and that she "eats poorly"..  She has not had any bleeding, bruising, or dark stools.  She notes she is not had any infectious symptoms such as fevers, chills, sweats, nausea, vomiting or diarrhea.  A full 10 point ROS was otherwise negative.  MEDICAL HISTORY:  Past Medical History:  Diagnosis Date   Allergy    Anxiety    Arthritis    oa   Bilateral inguinal hernia 04/06/2021   Cataract    Complication of anesthesia    slow to wake up  after 2019 colonscopy at dr hung  office   Depression    Dry eye syndrome    both eyes   Fuchs' corneal dystrophy    GERD (gastroesophageal reflux disease)    GI bleed 2021   Headache    History of migraine headaches    none recne per pt on 04-06-2021   History of osteopenia    Hypercholesterolemia    Hyperlipidemia    Hypertension    off bp meds 6 months due to low bp   Hypothyroidism    Inversion, nipple    right   Stomach ulcer    diagnosed 08/2017    Type 2 DM    does not check cbg, last hemaglobin 04-04-2021 was 5.8 on 04-04-2021 at dr pharr office    SURGICAL HISTORY: Past Surgical History:  Procedure Laterality Date   belly button surgery  2019   dr Ezzard Standing   bilteral thumb replacement     both yrs ago per pt on 04-07-2021   BIOPSY  10/16/2019   Procedure: BIOPSY;  Surgeon: Jeani Hawking, MD;  Location: [facility];  Service: Endoscopy;;   COLONOSCOPY WITH PROPOFOL N/A 10/16/2019   Procedure: COLONOSCOPY WITH PROPOFOL;  Surgeon: Jeani Hawking, MD;  Location: [facility];  Service: Endoscopy;  Laterality: N/A;   CORNEAL TRANSPLANT Bilateral    2007, 2010   DILATION AND CURETTAGE OF UTERUS     yrs ago per pt on 04-06-2021  ENTEROSCOPY N/A 10/16/2019   Procedure: ENTEROSCOPY;  Surgeon: Jeani Hawking, MD;  Location: [facility];  Service: Endoscopy;  Laterality: N/A;   EYE SURGERY Bilateral    both eyes cataracts yrs ago per pt on 04-07-2021   HAND SURGERY  02/2011   INGUINAL HERNIA REPAIR Bilateral 04/10/2021   Procedure: LAPAROSCOPIC BILATERAL INGUINAL HERNIA REPAIR WITH MESH;  Surgeon: Quentin Ore, MD;  Location: [facility];  Service: General;  Laterality: Bilateral;   left carpal tunnel release  Left 2019   at [facility] [place]   right carpal tunnel release     yrs ago per pt on 04-07-2021   right foot morton's neuroma surgery   1990   1980's and 1990's   right tennis elbow surgery     1990's   THYROIDECTOMY, PARTIAL  02/1971   center part removed  per pt   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   Procedure: RIGHT TOTAL HIP ARTHROPLASTY ANTERIOR APPROACH;  Surgeon: Durene Romans, MD;  Location: [facility];  Service: Orthopedics;  Laterality: Right;  70 mins   UMBILICAL HERNIA REPAIR  05/2017   dr Ezzard Standing   UPPER GI ENDOSCOPY  2021   and colonscopy @ [facility]  without problems    SOCIAL HISTORY: Social History   Socioeconomic History   Marital status: Married    Spouse name: Not on file   Number of children: Not on file   Years of education: Not on file   Highest education level: Not on file  Occupational History   Not on file  Tobacco Use   Smoking status: Former    Current packs/day: 0.00    Average packs/day: 1 pack/day for 30.0 years (30.0 ttl pk-yrs)    Types: Cigarettes    Start date: 11/08/1962    Quit date: 11/07/1992    Years since quitting: 30.7   Smokeless tobacco: Never  Vaping Use   Vaping status: Never Used  Substance and Sexual Activity   Alcohol use: Yes    Alcohol/week: 1.0 standard drink of alcohol    Types: 1 Glasses of wine per week    Comment: wine on occasion  2 or 3 x week   Drug use: No   Sexual activity: Yes    Birth control/protection: None  Other Topics Concern   Not on file  Social History Narrative   Not on file   Social Drivers of Health   Financial Resource Strain: Not on file  Food Insecurity: Not on file  Transportation Needs: Not on file  Physical Activity: Not on file  Stress: Not on file  Social Connections: Not on file  Intimate Partner Violence: Not on file    FAMILY HISTORY: Family History  Problem Relation Age of Onset   Diabetes Sister    Hypertension Sister    Breast cancer Neg Hx     ALLERGIES:  is allergic to ibuprofen, amlodipine, fluvastatin, meloxicam, and nsaids.  MEDICATIONS:  Current Outpatient Medications  Medication Sig Dispense Refill   acetaminophen (TYLENOL) 500 MG tablet Take 1,000 mg by mouth every 6 (six) hours as needed for moderate pain.      albuterol (VENTOLIN HFA) 108 (90 Base) MCG/ACT inhaler Inhale 2 puffs into the lungs every 6 (six) hours as needed for wheezing or shortness of breath. 8 g 6   alum & mag hydroxide-simeth (MAALOX/MYLANTA) 200-200-20 MG/5ML suspension Take 5 mLs by mouth every 6 (six) hours as needed for indigestion or heartburn.     aspirin  81 MG EC tablet Take by mouth.     cetirizine (ZYRTEC) 10 MG tablet Take 10 mg by mouth daily as needed for allergies.     Cetirizine HCl 10 MG CAPS Take by mouth. (Patient not taking: Reported on 04/16/2022)     clobetasol (TEMOVATE) 0.05 % external solution clobetasol 0.05 % scalp solution  APPLY TO AFFECTED AREA TWICE A DAY AS NEEDED (NOT TO FACE, GROIN, AXILLA)     Cyanocobalamin 2500 MCG SUBL 2,500 mcg.     denosumab (PROLIA) 60 MG/ML SOSY injection Inject 60 mg into the skin every 6 (six) months. Scheduled 10/20/2019     hydrocortisone cream 1 % Apply 1 application topically daily as needed for itching.     levothyroxine (SYNTHROID) 75 MCG tablet Take 75 mcg by mouth daily before breakfast. Takes 1/2 tab of 75 mcg on Sunday only and 75 mcg every other day     metFORMIN (GLUCOPHAGE-XR) 500 MG 24 hr tablet Take 500 mg by mouth 2 (two) times daily after a meal.     metFORMIN (GLUCOPHAGE-XR) 500 MG 24 hr tablet Take 1 tablet by mouth 2 (two) times daily.     rosuvastatin (CRESTOR) 20 MG tablet Take 20 mg by mouth daily after supper.     sertraline (ZOLOFT) 50 MG tablet Take 50 mg by mouth daily after breakfast.     telmisartan (MICARDIS) 20 MG tablet Take 20 mg by mouth daily. (Patient not taking: Reported on 04/16/2022)     UNABLE TO FIND Med Name vitamin b 12  2, 500 mcg sl daily     UNABLE TO FIND Med Name: childrens multi vitamin gummies  taking 2 po daily     VITAMIN D PO Take 100 mg by mouth 2 (two) times daily.     No current facility-administered medications for this visit.    REVIEW OF SYSTEMS:   Constitutional: ( - ) fevers, ( - )  chills , ( - ) night  sweats Eyes: ( - ) blurriness of vision, ( - ) double vision, ( - ) watery eyes Ears, nose, mouth, throat, and face: ( - ) mucositis, ( - ) sore throat Respiratory: ( - ) cough, ( - ) dyspnea, ( - ) wheezes Cardiovascular: ( - ) palpitation, ( - ) chest discomfort, ( - ) lower extremity swelling Gastrointestinal:  ( - ) nausea, ( - ) heartburn, ( - ) change in bowel habits Skin: ( - ) abnormal skin rashes Lymphatics: ( - ) new lymphadenopathy, ( - ) easy bruising Neurological: ( - ) numbness, ( - ) tingling, ( - ) new weaknesses Behavioral/Psych: ( - ) mood change, ( - ) new changes  All other systems were reviewed with the patient and are negative.  PHYSICAL EXAMINATION:  Vitals:   08/02/23 1445  BP: (!) 194/76  Pulse: 64  Resp: 13  Temp: 98 F (36.7 C)  SpO2: 96%     Filed Weights   08/02/23 1445  Weight: 139 lb 11.2 oz (63.4 kg)      GENERAL: Well-appearing elderly Caucasian female, alert, no distress and comfortable SKIN: skin color, texture, turgor are normal, no rashes or significant lesions EYES: conjunctiva are pink and non-injected, sclera clear LUNGS: clear to auscultation and percussion with normal breathing effort HEART: regular rate & rhythm and no murmurs and no lower extremity edema Musculoskeletal: no cyanosis of digits and no clubbing  PSYCH: alert & oriented x 3, fluent speech NEURO: no focal motor/sensory deficits  LABORATORY DATA:  I have reviewed the data as listed    Latest Ref Rng & Units 08/02/2023    2:18 PM 12/12/2022    1:45 PM 06/13/2022   10:06 AM  CBC  WBC 4.0 - 10.5 K/uL 6.6  9.3  5.0   Hemoglobin 12.0 - 15.0 g/dL 16.1  09.6  04.5   Hematocrit 36.0 - 46.0 % 44.6  45.9  44.0   Platelets 150 - 400 K/uL 264  247  236        Latest Ref Rng & Units 08/02/2023    2:18 PM 12/12/2022    1:45 PM 06/13/2022   10:06 AM  CMP  Glucose 70 - 99 mg/dL 409  85  97   BUN 8 - 23 mg/dL 18  18  17    Creatinine 0.44 - 1.00 mg/dL 8.11  9.14  7.82    Sodium 135 - 145 mmol/L 140  141  143   Potassium 3.5 - 5.1 mmol/L 4.0  4.1  3.9   Chloride 98 - 111 mmol/L 104  105  104   CO2 22 - 32 mmol/L 31  28  34   Calcium 8.9 - 10.3 mg/dL 9.1  9.5  8.8   Total Protein 6.5 - 8.1 g/dL 7.3  6.9  7.1   Total Bilirubin 0.0 - 1.2 mg/dL 0.7  0.6  0.5   Alkaline Phos 38 - 126 U/L 60  54  55   AST 15 - 41 U/L 19  18  23    ALT 0 - 44 U/L 11  11  12      Lab Results  Component Value Date   MPROTEIN Not Observed 03/12/2022   Lab Results  Component Value Date   KPAFRELGTCHN 26.0 (H) 03/12/2022   LAMBDASER 18.4 03/12/2022   KAPLAMBRATIO 1.41 03/12/2022     RADIOGRAPHIC STUDIES: No results found.  ASSESSMENT & PLAN Sandra Harvey 83 y.o. female with medical history significant for iron deficiency anemia who presents for a follow up visit.  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 intake.  Patient has had colonoscopy in 2021 with no clear source of bleeding. --Encouraged her to follow-up with GI, will make prompt referral if iron levels fall.  Patient followed by Dr. Elnoria Howard -- Patient unable to tolerate p.o. ferrous sulfate  --We will plan to proceed with IV iron therapy in order to help bolster the patient's blood counts if her iron levels are found to be low again. --Labs today show WBC 6.6, Hgb 14.3, MCV 95.3, Plt 264. Cr 0.86, AST 19, ALT 11 --Plan for return to clinic PRN as her iron levels and hemoglobin have been stable.  Recommend to be checked routinely with her PCP and we are happy to see her back if there are any concerning abnormalities   No orders of the defined types were placed in this encounter.   All questions were answered. The patient knows to call the clinic with any problems, questions or concerns.  A total of more than 30 minutes were spent on this encounter with face-to-face time and non-face-to-face time,  including preparing to see the patient, ordering tests and/or medications, counseling the patient and coordination of care as outlined above.   Ulysees Barns, MD Department of Hematology/Oncology [facility] Phone: [phone] Pager: [phone] Email: [email or web address]  08/04/2023 1:56 PM

## 2023-08-13 DIAGNOSIS — E1140 Type 2 diabetes mellitus with diabetic neuropathy, unspecified: Secondary | ICD-10-CM | POA: Diagnosis not present

## 2023-08-13 DIAGNOSIS — I10 Essential (primary) hypertension: Secondary | ICD-10-CM | POA: Diagnosis not present

## 2023-08-13 DIAGNOSIS — F339 Major depressive disorder, recurrent, unspecified: Secondary | ICD-10-CM | POA: Diagnosis not present

## 2023-08-13 DIAGNOSIS — E7800 Pure hypercholesterolemia, unspecified: Secondary | ICD-10-CM | POA: Diagnosis not present

## 2023-09-17 DIAGNOSIS — I10 Essential (primary) hypertension: Secondary | ICD-10-CM | POA: Diagnosis not present

## 2023-09-17 DIAGNOSIS — E1140 Type 2 diabetes mellitus with diabetic neuropathy, unspecified: Secondary | ICD-10-CM | POA: Diagnosis not present

## 2023-09-17 DIAGNOSIS — F339 Major depressive disorder, recurrent, unspecified: Secondary | ICD-10-CM | POA: Diagnosis not present

## 2023-09-17 DIAGNOSIS — E7800 Pure hypercholesterolemia, unspecified: Secondary | ICD-10-CM | POA: Diagnosis not present

## 2023-11-20 DIAGNOSIS — Z961 Presence of intraocular lens: Secondary | ICD-10-CM | POA: Diagnosis not present

## 2023-11-20 DIAGNOSIS — E119 Type 2 diabetes mellitus without complications: Secondary | ICD-10-CM | POA: Diagnosis not present

## 2023-11-20 DIAGNOSIS — Z947 Corneal transplant status: Secondary | ICD-10-CM | POA: Diagnosis not present

## 2023-11-21 DIAGNOSIS — M810 Age-related osteoporosis without current pathological fracture: Secondary | ICD-10-CM | POA: Diagnosis not present

## 2023-12-30 DIAGNOSIS — E119 Type 2 diabetes mellitus without complications: Secondary | ICD-10-CM | POA: Diagnosis not present

## 2023-12-30 DIAGNOSIS — I10 Essential (primary) hypertension: Secondary | ICD-10-CM | POA: Diagnosis not present

## 2023-12-30 DIAGNOSIS — E039 Hypothyroidism, unspecified: Secondary | ICD-10-CM | POA: Diagnosis not present

## 2024-01-02 DIAGNOSIS — E119 Type 2 diabetes mellitus without complications: Secondary | ICD-10-CM | POA: Diagnosis not present

## 2024-01-02 DIAGNOSIS — M810 Age-related osteoporosis without current pathological fracture: Secondary | ICD-10-CM | POA: Diagnosis not present

## 2024-01-02 DIAGNOSIS — I700 Atherosclerosis of aorta: Secondary | ICD-10-CM | POA: Diagnosis not present

## 2024-01-02 DIAGNOSIS — E039 Hypothyroidism, unspecified: Secondary | ICD-10-CM | POA: Diagnosis not present

## 2024-01-02 DIAGNOSIS — J310 Chronic rhinitis: Secondary | ICD-10-CM | POA: Diagnosis not present

## 2024-01-02 DIAGNOSIS — E538 Deficiency of other specified B group vitamins: Secondary | ICD-10-CM | POA: Diagnosis not present

## 2024-01-02 DIAGNOSIS — I10 Essential (primary) hypertension: Secondary | ICD-10-CM | POA: Diagnosis not present

## 2024-01-02 DIAGNOSIS — R0989 Other specified symptoms and signs involving the circulatory and respiratory systems: Secondary | ICD-10-CM | POA: Diagnosis not present

## 2024-01-02 DIAGNOSIS — F339 Major depressive disorder, recurrent, unspecified: Secondary | ICD-10-CM | POA: Diagnosis not present

## 2024-01-02 DIAGNOSIS — G629 Polyneuropathy, unspecified: Secondary | ICD-10-CM | POA: Diagnosis not present

## 2024-01-02 DIAGNOSIS — Z0000 Encounter for general adult medical examination without abnormal findings: Secondary | ICD-10-CM | POA: Diagnosis not present

## 2024-01-02 DIAGNOSIS — M159 Polyosteoarthritis, unspecified: Secondary | ICD-10-CM | POA: Diagnosis not present

## 2024-01-08 DIAGNOSIS — R0989 Other specified symptoms and signs involving the circulatory and respiratory systems: Secondary | ICD-10-CM | POA: Diagnosis not present
